# Patient Record
Sex: Female | Born: 1960
Health system: Southern US, Community
[De-identification: ages and names within clinical notes are randomized; demographics above are authoritative.]

## PROBLEM LIST (undated history)

## (undated) DIAGNOSIS — I2510 Atherosclerotic heart disease of native coronary artery without angina pectoris: Secondary | ICD-10-CM

## (undated) DIAGNOSIS — E785 Hyperlipidemia, unspecified: Secondary | ICD-10-CM

## (undated) DIAGNOSIS — I10 Essential (primary) hypertension: Secondary | ICD-10-CM

## (undated) HISTORY — DX: Essential (primary) hypertension: I10

## (undated) HISTORY — DX: Atherosclerotic heart disease of native coronary artery without angina pectoris: I25.10

## (undated) HISTORY — DX: Hyperlipidemia, unspecified: E78.5

---

## 1999-08-08 ENCOUNTER — Other Ambulatory Visit: Admission: RE | Admit: 1999-08-08 | Discharge: 1999-08-08 | Payer: Self-pay | Admitting: Gynecology

## 2005-03-27 ENCOUNTER — Other Ambulatory Visit: Admission: RE | Admit: 2005-03-27 | Discharge: 2005-03-27 | Payer: Self-pay | Admitting: Gynecology

## 2007-06-30 ENCOUNTER — Inpatient Hospital Stay (HOSPITAL_COMMUNITY): Admission: EM | Admit: 2007-06-30 | Discharge: 2007-07-03 | Payer: Self-pay | Admitting: Emergency Medicine

## 2007-07-18 ENCOUNTER — Encounter (HOSPITAL_COMMUNITY): Admission: RE | Admit: 2007-07-18 | Discharge: 2007-10-16 | Payer: Self-pay | Admitting: Interventional Cardiology

## 2007-10-17 ENCOUNTER — Encounter (HOSPITAL_COMMUNITY): Admission: RE | Admit: 2007-10-17 | Discharge: 2007-10-18 | Payer: Self-pay | Admitting: Interventional Cardiology

## 2009-02-01 IMAGING — CR DG CHEST 1V PORT
1 series · 1 of 1 positions shown · non-contrast
Comparison: None.

CLINICAL DATA: Post-cardiac cath for myocardial infarction.  
 PORTABLE CHEST ? 1 VIEW:

[AP]
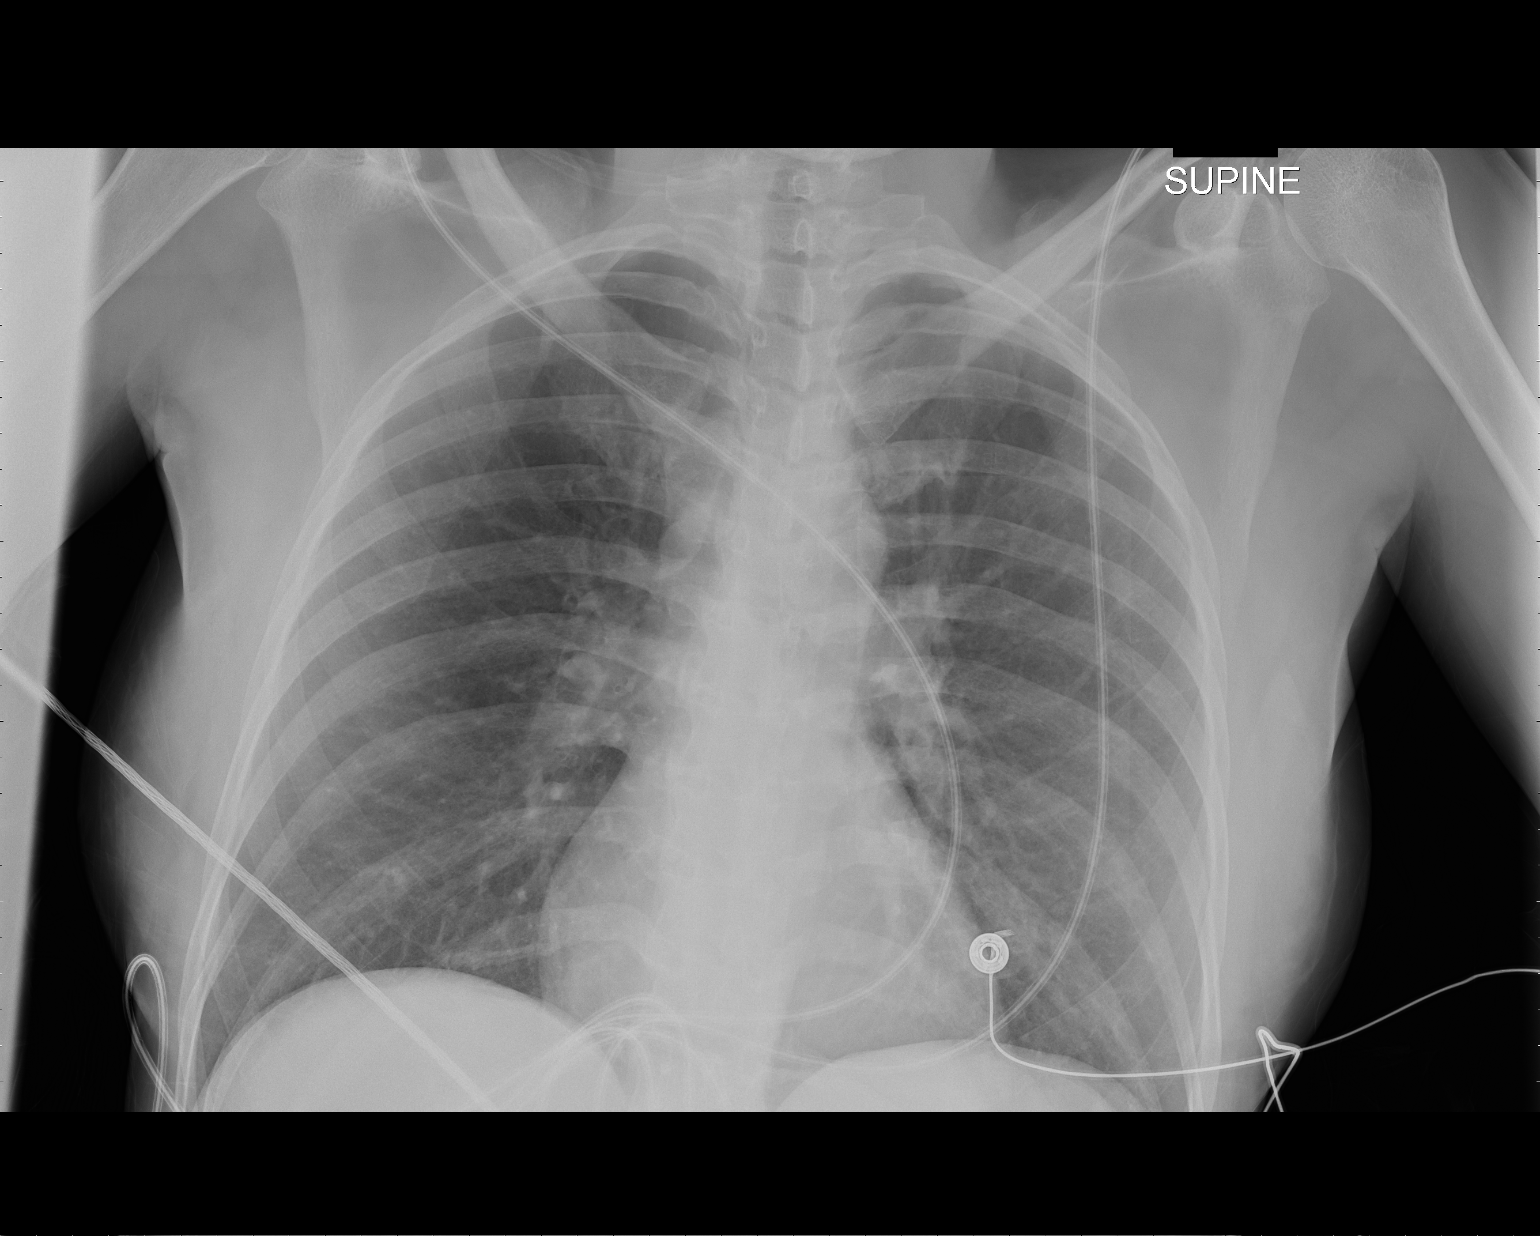

[1 of 1 positions shown; findings below may reference images not displayed]

FINDINGS: Trachea is midline.  Heart size normal.  Lungs are clear.  No pleural fluid.
IMPRESSION: No acute findings.

## 2010-10-18 NOTE — Cardiovascular Report (Signed)
NAMEDORCAS, MELITO NO.:  [id number]   MEDICAL RECORD NO.:  [id number]          PATIENT TYPE:  INP   LOCATION:  2913                         FACILITY:  [facility]   PHYSICIAN:  Lyn Records, M.D.   DATE OF BIRTH:  [birth year]   DATE OF PROCEDURE:  DATE OF DISCHARGE:                            CARDIAC CATHETERIZATION   REASON FOR CATHETERIZATION:  Acute inferior wall ST elevation myocardial  infarction.   PROCEDURES PERFORMED:  1. Right heart catheterization via right femoral approach.  2. Selective coronary angiography.  3. Left ventriculography.  4. Bare-metal stent implantation in the distal circumflex.   DESCRIPTION:  After informed consent, the patient was brought to the  cath lab under emergency circumstances.  The patient received aspirin  325 mg chewable in the emergency room.  In the cath lab, she was given  600 mg of oral Plavix and started on a bolus followed by an infusion of  Angiomax.  The Angiomax dose is 1.75 mg per kilogram per hour infusion  and a bolus of 0.75 mg per kilogram was given over 2 minutes at the  beginning of the infusion.  Because of inability for Plavix to have any  antiplatelet effect, we also gave a single bolus followed by an infusion  of Integrilin.  A bolus of 180 micrograms per kilogram was given  followed by an infusion set at 2 micrograms per kilogram per minute.   A 6-French sheath was started in the right femoral artery using modified  Seldinger technique.  Coronary angiography was then performed with a 6-  [place] A2 multipurpose catheter.  We used 6-French #4 left and right  Judkins catheters as well.  We identified the distal circumflex as the  infarct vessel.   We used a CLS 3.0 6-French left-system guide catheter.  We had a  difficult time wiring the distal circumflex because of an acute angle of  continuation after the large first obtuse marginal.  We eventually were  able to cross the stenosis using a BMW wire.   An Clinical cytogeneticist wire  continued to prolapse into the large obtuse marginal branch after we  would enter the circumflex.  After eventually getting what we felt was a  distal position in the distal circumflex, we performed angioplasty at  low pressure with a 2.5 Maverick balloon.  Realizing that the distal  vessel was considerably smaller than 2.5 in diameter, we downsized the  2.0-mm Maverick balloon and got reperfusion.  After recanalization of  the vessel, we deployed, in overlapping fashion, Vision 15-mm long  stents in the distal circumflex.  We reduced the 100% stenosis to 0%.  No complications occurred other than AIVR and some ventricular ectopy  after reperfusion.   We visualized the femoral artery but felt that it was too small in  diameter to close.   RESULTS:  1. Hemodynamic data:      a.     Aortic pressure 169/60.      b.     Left ventricular pressure 175/14.  2. Left ventriculography:  Inferobasal akinesis, EF 65%, no MR.  3. Coronary angiography:  a.     Left main coronary artery short with minimal plaquing.      b.     Left anterior descending coronary:  Large with a branching       large first diagonal, widely patent.      c.     Circumflex artery:  Normal with a large branching first       obtuse marginal.  Continuation of the circumflex beyond the first       obtuse marginal is totally occluded distally.      d.     Right coronary:  Large with a large PDA, widely patent.  4. Percutaneous coronary intervention:  The distal circumflex was 100%      occluded.  After overlap stenting with 2.25-mm Vision stents, the      stenosis was reduced to 0% with TIMI grade 3 flow.  The vessel was      postdilated with 2.25-mm diameter Quantum balloon to 13      atmospheres.   CONCLUSION:  1. Acute inferior wall ST-segment elevation myocardial infarction,      secondary to total occlusion of the distal circumflex beyond a      large first obtuse marginal.  2. Successful  bare-metal stent implantation with overlapping stents in      the distal circumflex,  taking a 100% stenosis to 0% with TIMI      grade 3 flow.  3. Widely patent left anterior descending, left main proximal and mid      circumflex, first obtuse marginal, and right coronary.  4. Inferior wall akinesis with preserved overall left ventricular      function, ejection fraction 60%.   PLAN:  Plavix, aspirin for 3 months.  Aggressive risk factor  modification including treatment of lipids.  Control of blood pressure.  ICU status.      Lyn Records, M.D.  Electronically Signed     HWS/MEDQ  D:  06/30/2007  T:  07/01/2007  Job:  [id number]   cc:   Marcial Pacas P. Fontaine, M.D.

## 2010-10-18 NOTE — H&P (Signed)
NAMECATHARINA, Ramirez NO.:  [id number]   MEDICAL RECORD NO.:  [id number]          PATIENT TYPE:  INP   LOCATION:  2913                         FACILITY:  [facility]   PHYSICIAN:  Lyn Records, M.D.   DATE OF BIRTH:  08-21-1960   DATE OF ADMISSION:  06/30/2007  DATE OF DISCHARGE:                              HISTORY & PHYSICAL   REASON FOR ADMISSION:  Acute inferior wall STEMI.   HISTORY OF PRESENT ILLNESS:  Ms. [name] is a very pleasant 50 year old  African-American female with no prior significant medical history.  She  has a history of diet-controlled hypertension.  She awakened this  morning at 4 a.m. with bilateral, left-greater-than-right arm aching,  nausea, vomiting, and mild chest tightness.  She came to the emergency  room at 11:30, where the EKG at 11:30 revealed mild inferior ST segment  elevation.  The call for STEMI did not go out, however, until  approximately 11:50 a.m.   In the emergency room, I questioned the patient.  I demonstrated that  her symptoms had been going on since around 4 a.m. when she awakened  with discomfort.  She gives no other history of prior heart trouble.  The EKG revealed 0.5 to 1 mm ofST elevation in II, III, and aVF.   PAST MEDICAL HISTORY:  1. Hypertension.  2. Hyperlipidemia.  3. Smoker.   HABITS:  Smokes cigarettes but does not drink alcohol.   PREVIOUS SURGERIES:  None.   FAMILY HISTORY:  History of xanthelasma in the family on her maternal  side.  No history of coronary atherosclerosis/coronary artery disease.   REVIEW OF SYSTEMS:  She is not pregnant.  She has never had a child.   ALLERGIES:  None.   PHYSICAL EXAMINATION:  VITAL SIGNS:  Blood pressure 178/88, heart rate  80, weight is not obtained.  SKIN:  Dry.  HEENT:  Xanthelasma is noted on the left eye.  There are petechiae or  telangiectasia on the face.  She states that she has had red spots like  this on her face before when she has had intractable  vomiting.  HEENT  exam grossly otherwise unremarkable.  NECK:  No JVD or carotid bruit.  LUNGS:  Clear to auscultation and percussion.  CARDIAC:  No murmur.  No rub.  No click.  No gallop.  ABDOMEN:  Soft, bowel sounds are normal.  No tenderness.  EXTREMITIES:  No edema.  Femoral, posterior tibial, brachial, and  carotid pulses are 2+.  NEUROLOGIC:  Normal.  No motor or sensory deficits noted.   EKG reveals 0.5 to 1 mm ST segment elevation, II, III, aVF with  reciprocal ST segment depression in V1 through V3.   Chest x-ray has not been performed.   ASSESSMENT:  1. Inferior ST-segment elevation myocardial infarction, approximately      9 to 10 hours in duration with continued left arm discomfort.  2. Hypertension.  3. Hyperlipidemia.  4. Suspect hypercholesterolemia with xanthelasma present on the left      eye.   PLAN:  1. Cath lab and discussion with the patient and husband about  procedure and risks, including death, myocardial infarction,      stroke, bleeding, ischemic limb, renal failure, and allergy.  2. Plavix 600 mg.  3. Angiomax as the antithrombotic.  4. Single dose Integrilin.  5. Intravenous nitroglycerin.  6. Aspirin.      Lyn Records, M.D.  Electronically Signed     HWS/MEDQ  D:  06/30/2007  T:  07/01/2007  Job:  [id number]   cc:   [name], M.D.

## 2010-10-18 NOTE — Discharge Summary (Signed)
NAMELAKEYSHIA, [name] NO.:  [id number]   MEDICAL RECORD NO.:  [id number]          PATIENT TYPE:  INP   LOCATION:  3708                         FACILITY:  [facility]   PHYSICIAN:  Lyn Records, M.D.   DATE OF BIRTH:  04-Jun-1961   DATE OF ADMISSION:  06/30/2007  DATE OF DISCHARGE:  07/03/2007                               DISCHARGE SUMMARY   ADMITTING DIAGNOSIS:  Acute posterior wall myocardial infarction.   DISCHARGE DIAGNOSES:  1. Coronary atherosclerotic heart disease.      a.     Posterior myocardial infarction treated with bare-metal       stents approximately 12 hours into the infarction on June 30, 2007.  2. Hyperlipidemia.  3. Labile blood pressure.  4. Cigarette smoker.   CONDITION AT DISCHARGE:  Improved.   PROCEDURE PERFORMED:  Acute cardiac catheterization with percutaneous  coronary intervention and implantation of overlapping bare-metal stents  in the distal circumflex.   DISCHARGE PLAN:  1. Follow up Dr. Verdis Prime on July 17, 2007 at 9:45 AM.  2. Medications:      a.     Plavix 75 mg per day for at least a month.      b.     Aspirin 325 mg per day.      c.     Metoprolol ER 25 mg per day.      d.     Simvastatin 40 mg per day.      e.     Nitroglycerin 0.4 mg sublingually p.r.n.   ACTIVITY:  1. Activity has been discussed with the patient.  She is strongly      encouraged to enroll in phase 2 cardiac rehab.  2. She should not return to work until after office visit on July 17, 2007.  3. She should call if any recurrent chest pain.   COMMENTS:  The patient was admitted to the hospital on June 30, 2007  after being awakened from sleep with left greater than right arm pain at  4 a.m.  Her presentation to [facility] was on 11:30 in the morning, some  7-1/2 to 8 hours into the presentation.  She made it to the cath lab by  12:30 where successful percutaneous intervention was performed.  Please  refer to the cardiac  cath report.   The patient is discharged on the medical regimen listed above and will  have phase 2 cardiac rehab.   We discussed her prognosis which overall should be quite good assuming  aggressive risk factor modification and smoking cessation.      Lyn Records, M.D.  Electronically Signed     HWS/MEDQ  D:  07/03/2007  T:  07/03/2007  Job:  [id number]

## 2011-02-24 LAB — I-STAT 8, (EC8 V) (CONVERTED LAB)
BUN: 5 — ABNORMAL LOW
Chloride: 107
Glucose, Bld: 116 — ABNORMAL HIGH
HCT: 48 — ABNORMAL HIGH
Hemoglobin: 16.3 — ABNORMAL HIGH
Operator id: 265201
Potassium: 3.9
Sodium: 138

## 2011-02-24 LAB — COMPREHENSIVE METABOLIC PANEL
ALT: 24
AST: 106 — ABNORMAL HIGH
CO2: 19
Calcium: 8.1 — ABNORMAL LOW
Chloride: 104
Creatinine, Ser: 0.49
GFR calc non Af Amer: >60
Glucose, Bld: 108 — ABNORMAL HIGH
Total Bilirubin: 0.5

## 2011-02-24 LAB — POCT CARDIAC MARKERS
Operator id: 265201
Troponin i, poc: 0.28 — ABNORMAL HIGH

## 2011-02-24 LAB — BASIC METABOLIC PANEL
BUN: 5 — ABNORMAL LOW
Chloride: 108
Creatinine, Ser: 0.57
GFR calc non Af Amer: >60

## 2011-02-24 LAB — DIFFERENTIAL
Basophils Absolute: 0
Basophils Relative: 0
Eosinophils Relative: 0
Lymphocytes Relative: 8 — ABNORMAL LOW
Neutro Abs: 9.6 — ABNORMAL HIGH

## 2011-02-24 LAB — LIPID PANEL: Total CHOL/HDL Ratio: 3.9

## 2011-02-24 LAB — PROTIME-INR
Prothrombin Time: 12.7
Prothrombin Time: 13.8

## 2011-02-24 LAB — CBC
HCT: 43.3
MCHC: 33.7
Platelets: 171
Platelets: 275
RDW: 12.9
RDW: 13
WBC: 11.1 — ABNORMAL HIGH

## 2011-02-24 LAB — CK TOTAL AND CKMB (NOT AT ARMC)
CK, MB: 136.2 — ABNORMAL HIGH
CK, MB: 237.6 — ABNORMAL HIGH
Relative Index: 15.3 — ABNORMAL HIGH
Total CK: 1334 — ABNORMAL HIGH
Total CK: 888 — ABNORMAL HIGH

## 2011-02-24 LAB — I-STAT EC8
Acid-base deficit: 9 — ABNORMAL HIGH
Bicarbonate: 17.3 — ABNORMAL LOW
Hemoglobin: 12.2
Operator id: 211741
Potassium: 3.2 — ABNORMAL LOW
Sodium: 123 — ABNORMAL LOW
TCO2: 18

## 2011-02-24 LAB — TSH: TSH: 0.635

## 2011-02-24 LAB — APTT: aPTT: 24

## 2011-02-24 LAB — HOMOCYSTEINE: Homocysteine: 6.4

## 2013-03-10 ENCOUNTER — Other Ambulatory Visit: Payer: Self-pay | Admitting: Interventional Cardiology

## 2013-09-04 ENCOUNTER — Other Ambulatory Visit: Payer: Self-pay | Admitting: *Deleted

## 2013-09-04 DIAGNOSIS — E785 Hyperlipidemia, unspecified: Secondary | ICD-10-CM

## 2013-09-10 ENCOUNTER — Other Ambulatory Visit (INDEPENDENT_AMBULATORY_CARE_PROVIDER_SITE_OTHER): Payer: BC Managed Care – PPO

## 2013-09-10 ENCOUNTER — Other Ambulatory Visit: Payer: Self-pay | Admitting: Cardiology

## 2013-09-10 DIAGNOSIS — E785 Hyperlipidemia, unspecified: Secondary | ICD-10-CM

## 2013-09-10 LAB — HEPATIC FUNCTION PANEL
ALK PHOS: 44 U/L (ref 39–117)
ALT: 11 U/L (ref 0–35)
AST: 15 U/L (ref 0–37)
Albumin: 3.8 g/dL (ref 3.5–5.2)
BILIRUBIN DIRECT: 0 mg/dL (ref 0.0–0.3)
BILIRUBIN TOTAL: 0.6 mg/dL (ref 0.3–1.2)
Total Protein: 6.7 g/dL (ref 6.0–8.3)

## 2013-09-10 LAB — LIPID PANEL
Cholesterol: 142 mg/dL (ref 0–200)
HDL: 55.4 mg/dL (ref >39.00)
LDL CALC: 70 mg/dL (ref 0–99)
Total CHOL/HDL Ratio: 3
Triglycerides: 85 mg/dL (ref 0.0–149.0)
VLDL: 17 mg/dL (ref 0.0–40.0)

## 2013-09-10 MED ORDER — LISINOPRIL-HYDROCHLOROTHIAZIDE 20-12.5 MG PO TABS: ORAL_TABLET | ORAL | Status: DC

## 2013-09-18 ENCOUNTER — Telehealth: Payer: Self-pay

## 2013-09-18 DIAGNOSIS — E785 Hyperlipidemia, unspecified: Secondary | ICD-10-CM

## 2013-09-18 NOTE — Telephone Encounter (Signed)
pt given lab results.Very very good. No change in therapy. Will need repeat in 1 year.pt verbalized understanding.pt rqst copy be mailed.

## 2013-09-18 NOTE — Telephone Encounter (Signed)
Message copied by Jarvis NewcomerPARRIS-GODLEY, Zalmen Wrightsman S on Thu Sep 18, 2013  3:30 PM ------      Message from: Verdis PrimeSMITH, HENRY      Created: Thu Sep 11, 2013  4:33 PM       Very very good. No change in therapy. Will need repeat in 1 year ------

## 2013-09-22 ENCOUNTER — Ambulatory Visit (INDEPENDENT_AMBULATORY_CARE_PROVIDER_SITE_OTHER): Payer: BC Managed Care – PPO | Admitting: Interventional Cardiology

## 2013-09-22 ENCOUNTER — Encounter: Payer: Self-pay | Admitting: Interventional Cardiology

## 2013-09-22 VITALS — BP 120/60 | HR 63 | Ht 62.0 in | Wt 113.0 lb

## 2013-09-22 DIAGNOSIS — I2510 Atherosclerotic heart disease of native coronary artery without angina pectoris: Secondary | ICD-10-CM

## 2013-09-22 DIAGNOSIS — E785 Hyperlipidemia, unspecified: Secondary | ICD-10-CM

## 2013-09-22 DIAGNOSIS — I10 Essential (primary) hypertension: Secondary | ICD-10-CM

## 2013-09-22 HISTORY — DX: Hyperlipidemia, unspecified: E78.5

## 2013-09-22 HISTORY — DX: Atherosclerotic heart disease of native coronary artery without angina pectoris: I25.10

## 2013-09-22 HISTORY — DX: Essential (primary) hypertension: I10

## 2013-09-22 NOTE — Progress Notes (Signed)
Patient ID: [name], female   DOB: 06/17/1960, 53 y.o.   MRN: [id number]    [address] [place], [place]  [address] Phone: [phone] Fax:  [phone]  Date:  09/22/2013   ID:  [name], DOB 08/29/1960, MRN [id number]  PCP:  No primary provider on file.   ASSESSMENT:  1. Coronary artery disease, history of bare-metal stent in circumflex 2009 2. Hyperlipidemia, stable on therapy 3. Hypertension, controlled  PLAN:  1. Exercise treadmill test to rule out in-stent restenosis of circumflex 2. Clinical followup one year after exercise treadmill test assuming the stress test is normal 3. No change in medical therapy   SUBJECTIVE: [name] is a 53 y.o. female who is asymptomatic now 6 years status post acute myocardial infarction requiring bare-metal stent implantation in the circumflex. She has discontinued smoking. She remains active. She has no limitations.  Wt Readings from Last 3 Encounters:  09/22/13 113 lb (51.256 kg)     Past Medical History  Diagnosis Date  . CAD in native artery 09/22/2013    Bare-metal stent, circumflex, 2009   . Hyperlipidemia 09/22/2013  . Essential hypertension 09/22/2013    Current Outpatient Prescriptions  Medication Sig Dispense Refill  . amLODipine (NORVASC) 5 MG tablet Take 5 mg by mouth daily.      Marland Kitchen. aspirin 81 MG tablet Take 81 mg by mouth daily.      Marland Kitchen. atorvastatin (LIPITOR) 20 MG tablet Take 20 mg by mouth daily.      Marland Kitchen. lisinopril-hydrochlorothiazide (PRINZIDE,ZESTORETIC) 20-12.5 MG per tablet TAKE 1 TABLET BY MOUTH EVERY DAY  90 tablet  0  . metoprolol succinate (TOPROL-XL) 50 MG 24 hr tablet Take 50 mg by mouth daily. Take with or immediately following a meal.       No current facility-administered medications for this visit.    Allergies:    Allergies  Allergen Reactions  . Sulfa Antibiotics     Social History:  The patient     ROS:  Please see the history of present illness.   Denies  edema, orthopnea, PND.   All other systems reviewed and negative.   OBJECTIVE: VS:  BP 120/60  Pulse 63  Ht 5\' 2"  (1.575 m)  Wt 113 lb (51.256 kg)  BMI 20.66 kg/m2 Well nourished, well developed, in no acute distress, younger than stated age HEENT: normal Neck: JVD flat. Carotid bruit 2+  Cardiac:  normal S1, S2; RRR; no murmur Lungs:  clear to auscultation bilaterally, no wheezing, rhonchi or rales Abd: soft, nontender, no hepatomegaly Ext: Edema absent. Pulses 2+ Skin: warm and dry Neuro:  CNs 2-12 intact, no focal abnormalities noted  EKG:  Normal sinus rhythm with inferior biphasic T waves. Otherwise unremarkable.       Signed, Darci NeedleHenry W. B. Chasten Blaze III, MD 09/22/2013 10:01 AM

## 2013-09-22 NOTE — Patient Instructions (Signed)
Your physician recommends that you continue on your current medications as directed. Please refer to the Current Medication list given to you today.  Your physician has requested that you have an exercise tolerance test. For further information please visit [email or web address]. Please also follow instruction sheet, as given.   Your physician wants you to follow-up in: 1 year You will receive a reminder letter in the mail two months in advance. If you don't receive a letter, please call our office to schedule the follow-up appointment.  

## 2013-10-10 ENCOUNTER — Other Ambulatory Visit: Payer: Self-pay

## 2013-10-10 MED ORDER — AMLODIPINE BESYLATE 5 MG PO TABS: 5.0000 mg | ORAL_TABLET | Freq: Every day | ORAL | Status: DC

## 2013-10-10 MED ORDER — METOPROLOL SUCCINATE ER 50 MG PO TB24: 50.0000 mg | ORAL_TABLET | Freq: Every day | ORAL | Status: DC

## 2013-10-31 ENCOUNTER — Ambulatory Visit (INDEPENDENT_AMBULATORY_CARE_PROVIDER_SITE_OTHER): Payer: BC Managed Care – PPO | Admitting: Nurse Practitioner

## 2013-10-31 ENCOUNTER — Encounter: Payer: Self-pay | Admitting: Nurse Practitioner

## 2013-10-31 VITALS — BP 132/90 | HR 81

## 2013-10-31 DIAGNOSIS — I2510 Atherosclerotic heart disease of native coronary artery without angina pectoris: Secondary | ICD-10-CM

## 2013-10-31 NOTE — Progress Notes (Signed)
Exercise Treadmill Test  Pre-Exercise Testing Evaluation Rhythm: normal sinus  Rate: 67 bpm     Test  Exercise Tolerance Test Ordering MD: Verdis Prime, MD  Interpreting MD: Norma Fredrickson, NP  Unique Test No: 1  Treadmill:  1  Indication for ETT: known ASHD  Contraindication to ETT: No   Stress Modality: exercise - treadmill  Cardiac Imaging Performed: non   Protocol: standard Bruce - maximal  Max BP:  171/92  Max MPHR (bpm):  170 85% MPR (bpm):  145  MPHR obtained (bpm):  149 % MPHR obtained:  87%  Reached 85% MPHR (min:sec):  10:30 Total Exercise Time (min-sec):  11 minutes  Workload in METS:  13.3 Borg Scale: 19  Reason ETT Terminated:  patient's desire to stop    ST Segment Analysis At Rest: normal ST segments - no evidence of significant ST depression - has resting T wave changes With Exercise: non-specific ST changes  Other Information Arrhythmia:  Yes - rare PVC and couplet Angina during ETT:  absent (0) Quality of ETT:  diagnostic  ETT Interpretation:  normal - no evidence of ischemia by ST analysis  Comments: Patient presents today for routine GXT. Has had CAD with past BMS in 2009. Former smoker, has, HTN, and HLD.   Today the patient exercised on the standard Bruce protocol for a total of 11 minutes.  Good exercise tolerance.  Adequate blood pressure response.  Clinically negative for chest pain. Test was stopped due to fatigue.  EKG negative for ischemia - has resting changes. No significant arrhythmia noted.    Recommendations: CV risk factor modification. See back as planned.  Patient is agreeable to this plan and will call if any problems develop in the interim.   Rosalio Macadamia, RN, ANP-C [facility] [address] [place], [place]  [address] [phone]

## 2013-12-11 ENCOUNTER — Other Ambulatory Visit: Payer: Self-pay

## 2013-12-11 MED ORDER — ATORVASTATIN CALCIUM 20 MG PO TABS: 20.0000 mg | ORAL_TABLET | Freq: Every day | ORAL | Status: DC

## 2013-12-26 ENCOUNTER — Encounter: Payer: Self-pay | Admitting: Interventional Cardiology

## 2014-01-13 ENCOUNTER — Other Ambulatory Visit: Payer: Self-pay | Admitting: Interventional Cardiology

## 2014-01-20 ENCOUNTER — Other Ambulatory Visit: Payer: Self-pay | Admitting: Interventional Cardiology

## 2014-01-22 ENCOUNTER — Other Ambulatory Visit: Payer: Self-pay | Admitting: Interventional Cardiology

## 2014-05-13 ENCOUNTER — Other Ambulatory Visit: Payer: Self-pay | Admitting: *Deleted

## 2014-05-13 MED ORDER — AMLODIPINE BESYLATE 5 MG PO TABS: 5.0000 mg | ORAL_TABLET | Freq: Every day | ORAL | Status: DC

## 2014-05-13 MED ORDER — METOPROLOL SUCCINATE ER 50 MG PO TB24: 50.0000 mg | ORAL_TABLET | Freq: Every day | ORAL | Status: DC

## 2014-09-12 ENCOUNTER — Other Ambulatory Visit: Payer: Self-pay | Admitting: Interventional Cardiology

## 2014-09-17 ENCOUNTER — Other Ambulatory Visit: Payer: Self-pay | Admitting: Interventional Cardiology

## 2014-10-14 ENCOUNTER — Other Ambulatory Visit: Payer: Self-pay | Admitting: Interventional Cardiology

## 2014-11-10 ENCOUNTER — Other Ambulatory Visit: Payer: Self-pay | Admitting: Interventional Cardiology

## 2014-12-02 ENCOUNTER — Encounter: Payer: Self-pay | Admitting: *Deleted

## 2014-12-02 NOTE — Progress Notes (Signed)
Cardiology Office Note   Date:  12/03/2014   ID:  [name], DOB 09/23/1960, MRN [id number]  PCP:  No primary care provider on file.  Cardiologist:  Sinclair Grooms, MD   Chief Complaint  Patient presents with  . Coronary Artery Disease      History of Present Illness: Taylan Mayhan is a 54 y.o. female who presents for CAD (posterior infarct treated with overlapping bare-metal stents in distal circumflex 2009), hypertension, and hyperlipidemia.  She has had a great year. She is totally asymptomatic. Denies any episodes of chest pain shortness of breath or palpitations. No medication side effects.    Past Medical History  Diagnosis Date  . CAD in native artery 09/22/2013    Bare-metal stent, circumflex, 2009   . Hyperlipidemia 09/22/2013  . Essential hypertension 09/22/2013    No past surgical history on file.   Current Outpatient Prescriptions  Medication Sig Dispense Refill  . amLODipine (NORVASC) 5 MG tablet Take 1 tablet (5 mg total) by mouth daily. 30 tablet 11  . aspirin 81 MG tablet Take 81 mg by mouth daily.    Marland Kitchen atorvastatin (LIPITOR) 20 MG tablet Take 1 tablet (20 mg total) by mouth daily. 30 tablet 11  . lisinopril-hydrochlorothiazide (PRINZIDE,ZESTORETIC) 20-12.5 MG per tablet Take 1 tablet by mouth daily. 30 tablet 11  . metoprolol succinate (TOPROL-XL) 50 MG 24 hr tablet TAKE 1 TABLET (50 MG TOTAL) BY MOUTH DAILY. TAKE WITH OR IMMEDIATELY FOLLOWING A MEAL. 30 tablet 11   No current facility-administered medications for this visit.    Allergies:   Sulfa antibiotics    Social History:  The patient  reports that she quit smoking about 7 years ago. She does not have any smokeless tobacco history on file. She reports that she drinks alcohol.   Family History:  The patient's family history includes Aneurysm in her mother; Cancer in her father; Hypertension in her brother, brother, and sister.    ROS:  Please see the history of present illness.    Otherwise, review of systems are positive for none.   All other systems are reviewed and negative.    PHYSICAL EXAM: VS:  BP 138/82 mmHg  Pulse 61  Ht _0  (1.575 m)  Wt 52.672 kg (116 lb 1.9 oz)  BMI 21.23 kg/m2  LMP 11/23/2014 (Exact Date) , BMI Body mass index is 21.23 kg/(m^2). GEN: Well nourished, well developed, in no acute distress HEENT: normal Neck: no JVD, carotid bruits, or masses Cardiac: RRR; no murmurs, rubs, or gallops,no edema  Respiratory:  clear to auscultation bilaterally, normal work of breathing GI: soft, nontender, nondistended, + BS MS: no deformity or atrophy Skin: warm and dry, no rash Neuro:  Strength and sensation are intact Psych: euthymic mood, full affect   EKG:  EKG is ordered today. The ekg ordered today demonstrates a sinus rhythm with nonspecific T-wave flattening   Recent Labs: No results found for requested labs within last 365 days.    Lipid Panel    Component Value Date/Time   CHOL 142 09/10/2013 0756   TRIG 85.0 09/10/2013 0756   HDL 55.40 09/10/2013 0756   CHOLHDL 3 09/10/2013 0756   VLDL 17.0 09/10/2013 0756   LDLCALC 70 09/10/2013 0756      Wt Readings from Last 3 Encounters:  12/03/14 52.672 kg (116 lb 1.9 oz)  09/22/13 51.256 kg (113 lb)      Other studies Reviewed: Additional studies/ records that were reviewed today include: .  ASSESSMENT AND PLAN:  1. CAD in native artery Asymptomatic  2. Essential hypertension Controlled. We need to check her electrolytes to make sure kidney function and potassium were stable.  3. Hyperlipidemia Current status not known but will be checked today    Current medicines are reviewed at length with the patient today.  The patient does not have concerns regarding medicines.  The following changes have been made:  no change  Labs/ tests ordered today include:   Orders Placed This Encounter  Procedures  . Lipid panel  . Comp Met (CMET)  . EKG 12-Lead      Disposition:   FU with HS in 1 year  Signed, Sinclair Grooms, MD  12/03/2014 8:44 AM    [facility] [address], [place], [place]  [address] Phone: [phone]; Fax: [phone]

## 2014-12-03 ENCOUNTER — Ambulatory Visit (INDEPENDENT_AMBULATORY_CARE_PROVIDER_SITE_OTHER): Payer: BLUE CROSS/BLUE SHIELD | Admitting: Interventional Cardiology

## 2014-12-03 ENCOUNTER — Telehealth: Payer: Self-pay

## 2014-12-03 ENCOUNTER — Other Ambulatory Visit (INDEPENDENT_AMBULATORY_CARE_PROVIDER_SITE_OTHER): Payer: BLUE CROSS/BLUE SHIELD

## 2014-12-03 ENCOUNTER — Encounter: Payer: Self-pay | Admitting: Interventional Cardiology

## 2014-12-03 VITALS — BP 138/82 | HR 61 | Ht 62.0 in | Wt 116.1 lb

## 2014-12-03 DIAGNOSIS — E785 Hyperlipidemia, unspecified: Secondary | ICD-10-CM | POA: Diagnosis not present

## 2014-12-03 DIAGNOSIS — I2510 Atherosclerotic heart disease of native coronary artery without angina pectoris: Secondary | ICD-10-CM

## 2014-12-03 DIAGNOSIS — I10 Essential (primary) hypertension: Secondary | ICD-10-CM | POA: Diagnosis not present

## 2014-12-03 LAB — COMPREHENSIVE METABOLIC PANEL
ALBUMIN: 4.1 g/dL (ref 3.5–5.2)
ALT: 13 U/L (ref 0–35)
AST: 16 U/L (ref 0–37)
Alkaline Phosphatase: 56 U/L (ref 39–117)
BUN: 12 mg/dL (ref 6–23)
CALCIUM: 9.3 mg/dL (ref 8.4–10.5)
CO2: 29 mEq/L (ref 19–32)
CREATININE: 0.6 mg/dL (ref 0.40–1.20)
Chloride: 105 mEq/L (ref 96–112)
GFR: 135.01 mL/min (ref >60.00)
GLUCOSE: 107 mg/dL — AB (ref 70–99)
Potassium: 3.6 mEq/L (ref 3.5–5.1)
Sodium: 140 mEq/L (ref 135–145)
TOTAL PROTEIN: 7 g/dL (ref 6.0–8.3)
Total Bilirubin: 0.3 mg/dL (ref 0.2–1.2)

## 2014-12-03 LAB — LIPID PANEL
Cholesterol: 168 mg/dL (ref 0–200)
HDL: 56.1 mg/dL (ref >39.00)
LDL Cholesterol: 97 mg/dL (ref 0–99)
NonHDL: 111.9
Total CHOL/HDL Ratio: 3
Triglycerides: 74 mg/dL (ref 0.0–149.0)
VLDL: 14.8 mg/dL (ref 0.0–40.0)

## 2014-12-03 MED ORDER — METOPROLOL SUCCINATE ER 50 MG PO TB24: ORAL_TABLET | ORAL | Status: DC

## 2014-12-03 MED ORDER — AMLODIPINE BESYLATE 5 MG PO TABS: 5.0000 mg | ORAL_TABLET | Freq: Every day | ORAL | Status: DC

## 2014-12-03 MED ORDER — ATORVASTATIN CALCIUM 20 MG PO TABS: 20.0000 mg | ORAL_TABLET | Freq: Every day | ORAL | Status: DC

## 2014-12-03 MED ORDER — LISINOPRIL-HYDROCHLOROTHIAZIDE 20-12.5 MG PO TABS: 1.0000 | ORAL_TABLET | Freq: Every day | ORAL | Status: DC

## 2014-12-03 NOTE — Patient Instructions (Signed)
Medication Instructions:  Your physician recommends that you continue on your current medications as directed. Please refer to the Current Medication list given to you today. Your medications have been refills have been sent to your pharmacy  Labwork: Lipid, Cmet  Testing/Procedures: None   Follow-Up: Your physician wants you to follow-up in: 1 year with Dr.Smith You will receive a reminder letter in the mail two months in advance. If you don't receive a letter, please call our office to schedule the follow-up appointment.   Any Other Special Instructions Will Be Listed Below (If Applicable).

## 2014-12-03 NOTE — Telephone Encounter (Signed)
-----   Message from Lyn RecordsHenry W Smith, MD sent at 12/03/2014  5:12 PM EDT ----- Lipids are at target. Liver tests, kidney function, and potassium all within a reasonable range. Blood sugar is minimally elevated.

## 2014-12-03 NOTE — Telephone Encounter (Signed)
Pt aware of lab results with verbal understanding Lipids are at target. Liver tests, kidney function, and potassium all within a reasonable range. Blood sugar is minimally elevated. Pt rqst a copy of labs be mailed. done

## 2014-12-23 ENCOUNTER — Other Ambulatory Visit: Payer: Self-pay | Admitting: *Deleted

## 2014-12-23 MED ORDER — ATORVASTATIN CALCIUM 20 MG PO TABS: 20.0000 mg | ORAL_TABLET | Freq: Every day | ORAL | Status: DC

## 2014-12-23 MED ORDER — LISINOPRIL-HYDROCHLOROTHIAZIDE 20-12.5 MG PO TABS: 1.0000 | ORAL_TABLET | Freq: Every day | ORAL | Status: DC

## 2014-12-23 MED ORDER — METOPROLOL SUCCINATE ER 50 MG PO TB24: ORAL_TABLET | ORAL | Status: DC

## 2015-01-04 ENCOUNTER — Other Ambulatory Visit: Payer: Self-pay

## 2015-01-04 ENCOUNTER — Telehealth: Payer: Self-pay | Admitting: Interventional Cardiology

## 2015-01-04 NOTE — Telephone Encounter (Signed)
Patient has changed prescription plans.  She would like her prescriptions called to [facility] :Fax# [phone] and Phone# [phone]

## 2015-01-05 ENCOUNTER — Other Ambulatory Visit: Payer: Self-pay | Admitting: *Deleted

## 2015-01-05 MED ORDER — LISINOPRIL-HYDROCHLOROTHIAZIDE 20-12.5 MG PO TABS: 1.0000 | ORAL_TABLET | Freq: Every day | ORAL | Status: DC

## 2015-01-05 MED ORDER — METOPROLOL SUCCINATE ER 50 MG PO TB24: ORAL_TABLET | ORAL | Status: DC

## 2015-01-05 MED ORDER — ATORVASTATIN CALCIUM 20 MG PO TABS: 20.0000 mg | ORAL_TABLET | Freq: Every day | ORAL | Status: DC

## 2015-01-08 ENCOUNTER — Other Ambulatory Visit: Payer: Self-pay

## 2015-01-08 MED ORDER — AMLODIPINE BESYLATE 5 MG PO TABS: 5.0000 mg | ORAL_TABLET | Freq: Every day | ORAL | Status: DC

## 2015-11-02 ENCOUNTER — Other Ambulatory Visit: Payer: Self-pay | Admitting: Interventional Cardiology

## 2016-01-11 ENCOUNTER — Other Ambulatory Visit: Payer: Self-pay | Admitting: *Deleted

## 2016-01-11 ENCOUNTER — Other Ambulatory Visit: Payer: Self-pay | Admitting: Interventional Cardiology

## 2016-01-13 ENCOUNTER — Other Ambulatory Visit: Payer: Self-pay | Admitting: *Deleted

## 2016-01-13 MED ORDER — AMLODIPINE BESYLATE 5 MG PO TABS: 5.0000 mg | ORAL_TABLET | Freq: Every day | ORAL | 0 refills | Status: DC

## 2016-01-14 ENCOUNTER — Other Ambulatory Visit: Payer: Self-pay | Admitting: *Deleted

## 2016-01-14 MED ORDER — METOPROLOL SUCCINATE ER 50 MG PO TB24: 50.0000 mg | ORAL_TABLET | Freq: Every day | ORAL | 0 refills | Status: DC

## 2016-01-14 MED ORDER — LISINOPRIL-HYDROCHLOROTHIAZIDE 20-12.5 MG PO TABS: 1.0000 | ORAL_TABLET | Freq: Every day | ORAL | 0 refills | Status: DC

## 2016-03-03 ENCOUNTER — Encounter: Payer: Self-pay | Admitting: Interventional Cardiology

## 2016-03-03 ENCOUNTER — Ambulatory Visit (INDEPENDENT_AMBULATORY_CARE_PROVIDER_SITE_OTHER): Payer: BLUE CROSS/BLUE SHIELD | Admitting: Interventional Cardiology

## 2016-03-03 ENCOUNTER — Encounter (INDEPENDENT_AMBULATORY_CARE_PROVIDER_SITE_OTHER): Payer: Self-pay

## 2016-03-03 ENCOUNTER — Other Ambulatory Visit: Payer: BLUE CROSS/BLUE SHIELD

## 2016-03-03 DIAGNOSIS — I2510 Atherosclerotic heart disease of native coronary artery without angina pectoris: Secondary | ICD-10-CM

## 2016-03-03 DIAGNOSIS — I10 Essential (primary) hypertension: Secondary | ICD-10-CM

## 2016-03-03 DIAGNOSIS — E785 Hyperlipidemia, unspecified: Secondary | ICD-10-CM | POA: Diagnosis not present

## 2016-03-03 LAB — LIPID PANEL
CHOL/HDL RATIO: 4.8 ratio (ref <=5.0)
CHOLESTEROL: 232 mg/dL — AB (ref 125–200)
HDL: 48 mg/dL (ref >=46)
LDL Cholesterol: 160 mg/dL — ABNORMAL HIGH (ref <130)
TRIGLYCERIDES: 119 mg/dL (ref <150)
VLDL: 24 mg/dL (ref <30)

## 2016-03-03 LAB — COMPREHENSIVE METABOLIC PANEL
ALBUMIN: 4.4 g/dL (ref 3.6–5.1)
ALK PHOS: 47 U/L (ref 33–130)
ALT: 10 U/L (ref 6–29)
AST: 14 U/L (ref 10–35)
BILIRUBIN TOTAL: 0.5 mg/dL (ref 0.2–1.2)
BUN: 11 mg/dL (ref 7–25)
CALCIUM: 9.6 mg/dL (ref 8.6–10.4)
CO2: 24 mmol/L (ref 20–31)
Chloride: 103 mmol/L (ref 98–110)
Creat: 0.62 mg/dL (ref 0.50–1.05)
GLUCOSE: 102 mg/dL — AB (ref 65–99)
POTASSIUM: 4.2 mmol/L (ref 3.5–5.3)
Sodium: 137 mmol/L (ref 135–146)
Total Protein: 7.1 g/dL (ref 6.1–8.1)

## 2016-03-03 NOTE — Patient Instructions (Signed)
Medication Instructions:  Your physician recommends that you continue on your current medications as directed. Please refer to the Current Medication list given to you today.   Labwork: Lipid and Cmet today   Testing/Procedures: None ordered  Follow-Up: Your physician wants you to follow-up in: 1 year with Dr.Smith You will receive a reminder letter in the mail two months in advance. If you don't receive a letter, please call our office to schedule the follow-up appointment.   Any Other Special Instructions Will Be Listed Below (If Applicable).     If you need a refill on your cardiac medications before your next appointment, please call your pharmacy.

## 2016-03-03 NOTE — Progress Notes (Signed)
Cardiology Office Note    Date:  03/03/2016   ID:  [name], DOB Aug 20, 1960, MRN [id number]  PCP:  No PCP Per Patient  Cardiologist: Lesleigh Noe, MD   Chief Complaint  Patient presents with  . Coronary Artery Disease    History of Present Illness:  [name] is a 55 y.o. female who presents for CAD (posterior infarct treated with overlapping bare-metal stents in distal circumflex 2009), hypertension, and hyperlipidemia  She is doing well. No cardiopulmonary complaints. She has not smoked cigarettes since her myocardial infarction 8 years ago. She has difficulty remembering to take her statin since remove the dosing tonight time. She has not been on it for quite some time. Lipids will be drawn today. She denies claudication. She has not had syncope. No neurological complaints.   Past Medical History:  Diagnosis Date  . CAD in native artery 09/22/2013   Bare-metal stent, circumflex, 2009   . Essential hypertension 09/22/2013  . Hyperlipidemia 09/22/2013    No past surgical history on file.  Current Medications: Outpatient Medications Prior to Visit  Medication Sig Dispense Refill  . amLODipine (NORVASC) 5 MG tablet Take 1 tablet (5 mg total) by mouth daily. 30 tablet 0  . aspirin 81 MG tablet Take 81 mg by mouth daily.    Marland Kitchen atorvastatin (LIPITOR) 20 MG tablet Take 1 tablet by mouth  daily 90 tablet 0  . lisinopril-hydrochlorothiazide (PRINZIDE,ZESTORETIC) 20-12.5 MG tablet Take 1 tablet by mouth daily. 90 tablet 0  . metoprolol succinate (TOPROL-XL) 50 MG 24 hr tablet Take 1 tablet (50 mg total) by mouth daily. Take with or immediately following a meal. 90 tablet 0   No facility-administered medications prior to visit.      Allergies:   Sulfa antibiotics   Social History   Social History  . Marital status: Single    Spouse name: N/A  . Number of children: N/A  . Years of education: N/A   Social History Main Topics  . Smoking status: Former Smoker    Packs/day: 1.00    Quit date: 09/04/2007  . Smokeless tobacco: Never Used  . Alcohol use Yes  . Drug use: Unknown  . Sexual activity: Not Asked   Other Topics Concern  . None   Social History Narrative  . None     Family History:  The patient's family history includes Aneurysm in her mother; Cancer in her father; Hypertension in her brother, brother, and sister.   ROS:   Please see the history of present illness.    No complaints other than not taking statin therapy because of nighttime dosing and being unable to remember  All other systems reviewed and are negative.   PHYSICAL EXAM:   VS:  BP 126/80   Pulse 71   Ht 5\' 1"  (1.549 m)   Wt 115 lb 9.6 oz (52.4 kg)   BMI 21.84 kg/m    GEN: Well nourished, well developed, in no acute distress  HEENT: normal  Neck: no JVD, carotid bruits, or masses Cardiac: RRR; no murmurs, rubs, or gallops,no edema  Respiratory:  clear to auscultation bilaterally, normal work of breathing GI: soft, nontender, nondistended, + BS MS: no deformity or atrophy  Skin: warm and dry, no rash Neuro:  Alert and Oriented x 3, Strength and sensation are intact Psych: euthymic mood, full affect  Wt Readings from Last 3 Encounters:  03/03/16 115 lb 9.6 oz (52.4 kg)  12/03/14 116 lb 1.9 oz (52.7 kg)  09/22/13 113 lb (51.3 kg)      Studies/Labs Reviewed:   EKG:  EKG Reveals sinus rhythm at 71 bpm old inferior infarct with barely significant Q waves and T-wave inversion.  Recent Labs: No results found for requested labs within last 8760 hours.   Lipid Panel    Component Value Date/Time   CHOL 168 12/03/2014 0849   TRIG 74.0 12/03/2014 0849   HDL 56.10 12/03/2014 0849   CHOLHDL 3 12/03/2014 0849   VLDL 14.8 12/03/2014 0849   LDLCALC 97 12/03/2014 0849    Additional studies/ records that were reviewed today include:   Her last lipid panel was relatively good with an LDL of 97. I believe she was on statin therapy at that time.  ASSESSMENT:      1. CAD in native artery   2. Essential hypertension   3. Hyperlipidemia      PLAN:  In order of problems listed above:  1. Asymptomatic with reference to coronary disease. Remains active. No functional testing. We will consider stress test next year. 2. Excellent blood pressure control. 2 g sodium diet. 3. Lipids are being drawn today. She is not on statin therapy. We will determine if 5 statin therapy is needed, I believe it will be. We will have to give her permission to take the statin whenever she can remember to take it and if it ends up being each morning, that would be preferable to not taking it at all.    Medication Adjustments/Labs and Tests Ordered: Current medicines are reviewed at length with the patient today.  Concerns regarding medicines are outlined above.  Medication changes, Labs and Tests ordered today are listed in the Patient Instructions below. Patient Instructions  Medication Instructions:  Your physician recommends that you continue on your current medications as directed. Please refer to the Current Medication list given to you today.   Labwork: Lipid and Cmet today   Testing/Procedures: None ordered  Follow-Up: Your physician wants you to follow-up in: 1 year with Dr.Gerardine Peltz You will receive a reminder letter in the mail two months in advance. If you don't receive a letter, please call our office to schedule the follow-up appointment.   Any Other Special Instructions Will Be Listed Below (If Applicable).     If you need a refill on your cardiac medications before your next appointment, please call your pharmacy.      Signed, Lesleigh NoeHenry W Elica Almas III, MD  03/03/2016 10:01 AM    [facility] [address], [place], [place]  [id number] Phone: [phone]; Fax: [phone]

## 2016-03-06 ENCOUNTER — Other Ambulatory Visit: Payer: Self-pay | Admitting: Interventional Cardiology

## 2016-03-06 ENCOUNTER — Telehealth: Payer: Self-pay | Admitting: Interventional Cardiology

## 2016-03-06 DIAGNOSIS — E785 Hyperlipidemia, unspecified: Secondary | ICD-10-CM

## 2016-03-06 NOTE — Telephone Encounter (Signed)
Mrs. [name] is returning a call about her results

## 2016-03-06 NOTE — Telephone Encounter (Signed)
Informed pt of lab results.  Pt's chart states Atorvastatin 20mg  QD and pt states that's what dosage she has at home.  Advised I will send message back to Dr. Katrinka BlazingSmith for clarification on dose he would like for pt to take.  Scheduled labs for 04/18/16.  Advised I will call back as soon as I get clarification.  Pt verbalized understanding and was appreciative for call.

## 2016-03-07 NOTE — Telephone Encounter (Signed)
20 mg is fine

## 2016-03-07 NOTE — Telephone Encounter (Signed)
Pt notified of response from Dr. Katrinka BlazingSmith

## 2016-04-18 ENCOUNTER — Other Ambulatory Visit: Payer: BLUE CROSS/BLUE SHIELD | Admitting: *Deleted

## 2016-04-18 DIAGNOSIS — E785 Hyperlipidemia, unspecified: Secondary | ICD-10-CM

## 2016-04-18 LAB — HEPATIC FUNCTION PANEL
ALT: 8 U/L (ref 6–29)
AST: 12 U/L (ref 10–35)
Albumin: 4.1 g/dL (ref 3.6–5.1)
Alkaline Phosphatase: 46 U/L (ref 33–130)
BILIRUBIN DIRECT: 0.1 mg/dL (ref <=0.2)
BILIRUBIN INDIRECT: 0.6 mg/dL (ref 0.2–1.2)
TOTAL PROTEIN: 6.7 g/dL (ref 6.1–8.1)
Total Bilirubin: 0.7 mg/dL (ref 0.2–1.2)

## 2016-04-18 LAB — LIPID PANEL
Cholesterol: 161 mg/dL (ref <200)
HDL: 50 mg/dL — ABNORMAL LOW (ref >50)
LDL CALC: 97 mg/dL (ref <100)
TRIGLYCERIDES: 69 mg/dL (ref <150)
Total CHOL/HDL Ratio: 3.2 Ratio (ref <5.0)
VLDL: 14 mg/dL (ref <30)

## 2016-04-22 ENCOUNTER — Other Ambulatory Visit: Payer: Self-pay | Admitting: Interventional Cardiology

## 2016-04-25 ENCOUNTER — Encounter: Payer: Self-pay | Admitting: *Deleted

## 2016-06-06 ENCOUNTER — Other Ambulatory Visit: Payer: Self-pay | Admitting: Interventional Cardiology

## 2016-12-19 ENCOUNTER — Telehealth: Payer: Self-pay | Admitting: Interventional Cardiology

## 2016-12-19 DIAGNOSIS — E785 Hyperlipidemia, unspecified: Secondary | ICD-10-CM

## 2016-12-19 DIAGNOSIS — I10 Essential (primary) hypertension: Secondary | ICD-10-CM

## 2016-12-19 DIAGNOSIS — I2510 Atherosclerotic heart disease of native coronary artery without angina pectoris: Secondary | ICD-10-CM

## 2016-12-19 NOTE — Telephone Encounter (Signed)
New message    Pt is calling about having lab work done before her appt with Dr. Katrinka BlazingSmith in October. Does she need labs?

## 2016-12-19 NOTE — Telephone Encounter (Signed)
Spoke with pt and made her aware that we did need to get fasting labs, CMET and Lipid on her.  Pt agreeable to come next Friday for labs.

## 2016-12-29 ENCOUNTER — Other Ambulatory Visit: Payer: BLUE CROSS/BLUE SHIELD

## 2017-01-02 ENCOUNTER — Other Ambulatory Visit: Payer: BLUE CROSS/BLUE SHIELD

## 2017-01-02 DIAGNOSIS — I10 Essential (primary) hypertension: Secondary | ICD-10-CM

## 2017-01-02 DIAGNOSIS — I2510 Atherosclerotic heart disease of native coronary artery without angina pectoris: Secondary | ICD-10-CM

## 2017-01-02 DIAGNOSIS — E785 Hyperlipidemia, unspecified: Secondary | ICD-10-CM | POA: Diagnosis not present

## 2017-01-02 LAB — COMPREHENSIVE METABOLIC PANEL
ALK PHOS: 56 IU/L (ref 39–117)
ALT: 7 IU/L (ref 0–32)
AST: 14 IU/L (ref 0–40)
Albumin/Globulin Ratio: 1.8 (ref 1.2–2.2)
Albumin: 4.2 g/dL (ref 3.5–5.5)
BUN/Creatinine Ratio: 16 (ref 9–23)
BUN: 8 mg/dL (ref 6–24)
Bilirubin Total: 0.3 mg/dL (ref 0.0–1.2)
CALCIUM: 8.7 mg/dL (ref 8.7–10.2)
CO2: 21 mmol/L (ref 20–29)
CREATININE: 0.51 mg/dL — AB (ref 0.57–1.00)
Chloride: 103 mmol/L (ref 96–106)
GFR calc Af Amer: 126 mL/min/{1.73_m2} (ref >59)
GFR, EST NON AFRICAN AMERICAN: 109 mL/min/{1.73_m2} (ref >59)
GLOBULIN, TOTAL: 2.4 g/dL (ref 1.5–4.5)
GLUCOSE: 101 mg/dL — AB (ref 65–99)
Potassium: 4.4 mmol/L (ref 3.5–5.2)
SODIUM: 139 mmol/L (ref 134–144)
Total Protein: 6.6 g/dL (ref 6.0–8.5)

## 2017-01-02 LAB — LIPID PANEL
CHOLESTEROL TOTAL: 141 mg/dL (ref 100–199)
Chol/HDL Ratio: 2.9 ratio (ref 0.0–4.4)
HDL: 48 mg/dL (ref >39)
LDL CALC: 73 mg/dL (ref 0–99)
TRIGLYCERIDES: 100 mg/dL (ref 0–149)
VLDL Cholesterol Cal: 20 mg/dL (ref 5–40)

## 2017-01-04 ENCOUNTER — Telehealth: Payer: Self-pay | Admitting: Interventional Cardiology

## 2017-01-04 NOTE — Telephone Encounter (Signed)
Follow Up: ° ° ° °Returning your call, concerning her lab results. °

## 2017-01-13 ENCOUNTER — Encounter (HOSPITAL_COMMUNITY): Payer: Self-pay | Admitting: Emergency Medicine

## 2017-01-13 ENCOUNTER — Ambulatory Visit (HOSPITAL_COMMUNITY)
Admission: EM | Admit: 2017-01-13 | Discharge: 2017-01-13 | Disposition: A | Discharge status: Home / Self Care | Payer: BLUE CROSS/BLUE SHIELD | Attending: Family Medicine | Admitting: Family Medicine

## 2017-01-13 DIAGNOSIS — M542 Cervicalgia: Secondary | ICD-10-CM | POA: Diagnosis not present

## 2017-01-13 DIAGNOSIS — S161XXA Strain of muscle, fascia and tendon at neck level, initial encounter: Secondary | ICD-10-CM

## 2017-01-13 NOTE — ED Provider Notes (Signed)
  [facility]   [id number] 01/13/17 Arrival Time: 1207  ASSESSMENT & PLAN:  1. Neck discomfort   2. Strain of neck muscle, initial encounter    Prefers OTC Aleve BID with food. Will f/u if not improving over the next week. Reviewed expectations re: course of current medical issues. Questions answered. Outlined signs and symptoms indicating need for more acute intervention. Patient verbalized understanding. After Visit Summary given.   SUBJECTIVE:  Willodean RosenthalKathryn Ellsworth is a 56 y.o. female who presents with complaint of L-sided neck soreness for the past one week. No trauma or injury reported. Fairly gradual onset. Occasionally radiates to L shoulder. No specific shoulder pain. No UE sensation changes or weakness. She is L handed. Neck discomfort worse when she turns her head to the left. No headaches or visual changes. No OTC treatment. Discomfort has not limited her.  ROS: As per HPI.   OBJECTIVE:  Vitals:   01/13/17 1237  BP: (!) 146/85  Pulse: 68  Resp: 16  Temp: 98.3 F (36.8 C)  TempSrc: Oral  SpO2: 99%     General appearance: alert; no distress HEENT: normocephalic; atraumatic Neck: supple without midline tenderness; mild and poorly localized lateral L neck discomfort with palpation; FROM Lungs: normal respirations Extremities: L shoulder with FROM; normal strength Skin: warm and dry Psychological:  alert and cooperative; normal mood and affect   Allergies  Allergen Reactions  . Sulfa Antibiotics Rash    PMHx, SurgHx, SocialHx, Medications, and Allergies were reviewed in the Visit Navigator and updated as appropriate.      Mardella LaymanHagler, Tru Leopard, MD 01/13/17 1311

## 2017-01-13 NOTE — ED Triage Notes (Signed)
[facility] a [facility] left shoulder pain that gets worse when she moves her neck. The patient reported the pain x 1 week and no known injury.

## 2017-01-13 NOTE — Discharge Instructions (Signed)
You may use OTC Aleve twice a day for the next week. Take with food. If not improving you may f/u here or with an orthopaedist.

## 2017-01-17 ENCOUNTER — Emergency Department (HOSPITAL_COMMUNITY): Payer: BLUE CROSS/BLUE SHIELD

## 2017-01-17 ENCOUNTER — Emergency Department (HOSPITAL_COMMUNITY)
Admission: EM | Admit: 2017-01-17 | Discharge: 2017-01-17 | Disposition: A | Discharge status: Home / Self Care | Payer: BLUE CROSS/BLUE SHIELD | Attending: Emergency Medicine | Admitting: Emergency Medicine

## 2017-01-17 ENCOUNTER — Encounter (HOSPITAL_COMMUNITY): Payer: Self-pay | Admitting: *Deleted

## 2017-01-17 DIAGNOSIS — M436 Torticollis: Secondary | ICD-10-CM

## 2017-01-17 DIAGNOSIS — I259 Chronic ischemic heart disease, unspecified: Secondary | ICD-10-CM | POA: Diagnosis not present

## 2017-01-17 DIAGNOSIS — M25512 Pain in left shoulder: Secondary | ICD-10-CM

## 2017-01-17 DIAGNOSIS — M542 Cervicalgia: Secondary | ICD-10-CM | POA: Diagnosis not present

## 2017-01-17 DIAGNOSIS — I10 Essential (primary) hypertension: Secondary | ICD-10-CM | POA: Insufficient documentation

## 2017-01-17 DIAGNOSIS — Z87891 Personal history of nicotine dependence: Secondary | ICD-10-CM | POA: Diagnosis not present

## 2017-01-17 MED ORDER — METHOCARBAMOL 500 MG PO TABS: 500.0000 mg | ORAL_TABLET | Freq: Two times a day (BID) | ORAL | 0 refills | Status: DC

## 2017-01-17 NOTE — ED Notes (Signed)
Patient transported to X-ray 

## 2017-01-17 NOTE — ED Notes (Signed)
Patient returned from X-ray 

## 2017-01-17 NOTE — ED Triage Notes (Signed)
PT was seen at [facility] last week for L neck pain that increases with movement.  Has been taking aleve as prescribed, but pain is increasing.  Only relief from pain is when she lays down.

## 2017-01-17 NOTE — Discharge Instructions (Signed)
Please read attached information regarding your condition. Apply heat as directed to affected area for 20 minutes at a time. Take Robaxin in addition to Aleve as directed. Return to ED for worsening pain, fevers, trouble moving neck, injuries, severe headaches, vomiting.

## 2017-01-17 NOTE — ED Provider Notes (Signed)
[facility] Note   CSN: [id number] Arrival date & time: 01/17/17  4098     History   Chief Complaint Chief Complaint  Patient presents with  . Neck Pain  . Shoulder Pain    HPI [name] is a 56 y.o. female.  HPI Patient, with a past medical history of HTN, CAD, presents to ED for evaluation of left-sided neck and shoulder pain that began 2 weeks ago. Pain worsens with looking to the right and improved when laying down. Denies any previous history of similar symptoms. She states that she was seen at an urgent care last week and has been taking the Aleve as prescribed, but the pain has not improved. She was also told to apply ice to the area.  She denies any chest pain, fever, chills, shortness of breath, injury, accident, falls, nausea, vomiting, prior back surgery, vision changes, headache.  Past Medical History:  Diagnosis Date  . CAD in native artery 09/22/2013   Bare-metal stent, circumflex, 2009   . Essential hypertension 09/22/2013  . Hyperlipidemia 09/22/2013    Patient Active Problem List   Diagnosis Date Noted  . CAD in native artery 09/22/2013  . Hyperlipidemia 09/22/2013  . Essential hypertension 09/22/2013    History reviewed. No pertinent surgical history.  OB History    No data available       Home Medications    Prior to Admission medications   Medication Sig Start Date End Date Taking? Authorizing Provider  amLODipine (NORVASC) 5 MG tablet TAKE 1 TABLET BY MOUTH  DAILY 04/24/16   Lyn Records, MD  aspirin 81 MG tablet Take 81 mg by mouth daily.    [provider]  atorvastatin (LIPITOR) 20 MG tablet TAKE 1 TABLET BY MOUTH  DAILY 03/07/16   Lyn Records, MD  lisinopril-hydrochlorothiazide (PRINZIDE,ZESTORETIC) 20-12.5 MG tablet TAKE 1 TABLET BY MOUTH  DAILY 06/06/16   Lyn Records, MD  methocarbamol (ROBAXIN) 500 MG tablet Take 1 tablet (500 mg total) by mouth 2 (two) times daily. 01/17/17   Laurren Lepkowski, PA-C    metoprolol succinate (TOPROL-XL) 50 MG 24 hr tablet TAKE 1 TABLET BY MOUTH  DAILY WITH OR IMMEDIATLEY  FOLLOWING A MEAL 06/06/16   Lyn Records, MD    Family History Family History  Problem Relation Age of Onset  . Cancer Father        LIVER  . Aneurysm Mother   . Hypertension Sister   . Hypertension Brother   . Hypertension Brother     Social History Social History  Substance Use Topics  . Smoking status: Former Smoker    Packs/day: 1.00    Quit date: 09/04/2007  . Smokeless tobacco: Never Used  . Alcohol use Yes     Comment: occ     Allergies   Sulfa antibiotics   Review of Systems Review of Systems  Constitutional: Negative for chills and fever.  HENT: Negative for facial swelling.   Eyes: Negative for visual disturbance.  Respiratory: Negative for shortness of breath.   Cardiovascular: Negative for chest pain.  Gastrointestinal: Negative for nausea and vomiting.  Musculoskeletal: Positive for myalgias and neck pain. Negative for arthralgias and neck stiffness.  Skin: Negative for rash.  Neurological: Negative for dizziness, weakness, numbness and headaches.     Physical Exam Updated Vital Signs BP 118/80   Pulse 63   Temp 98.1 F (36.7 C) (Oral)   Resp 18   Ht 5\' 1"  (1.549 m)  Wt 50.3 kg (111 lb)   LMP 01/11/2017   SpO2 100%   BMI 20.97 kg/m   Physical Exam  Constitutional: She appears well-developed and well-nourished. No distress.  Pleasant, nontoxic appearing and in no acute distress. Resting comfortably on chair.  HENT:  Head: Normocephalic and atraumatic.  Eyes: Conjunctivae and EOM are normal. No scleral icterus.  Neck: Normal range of motion. Neck supple.  Tenderness to palpation in the indicated area. No meningismus. No midline spinal tenderness present in lumbar, thoracic or cervical spine. No step-off palpated. No visible bruising, edema or temperature change noted. 2+ radial pulses bilaterally. Sensation intact to light touch.   Cardiovascular: Normal rate, regular rhythm and normal heart sounds.   Pulmonary/Chest: Effort normal and breath sounds normal. No respiratory distress.  Musculoskeletal: Normal range of motion. She exhibits tenderness. She exhibits no edema or deformity.       Back:  Lymphadenopathy:    She has no cervical adenopathy.  Neurological: She is alert.  Skin: No rash noted. She is not diaphoretic.  Psychiatric: She has a normal mood and affect.  Nursing note and vitals reviewed.    ED Treatments / Results  Labs (all labs ordered are listed, but only abnormal results are displayed) Labs Reviewed - No data to display  EKG  EKG Interpretation None       Radiology Dg Cervical Spine Complete  Result Date: 01/17/2017 CLINICAL DATA:  Posterior left neck pain for 2 weeks. No known injury. EXAM: CERVICAL SPINE - COMPLETE 4+ VIEW COMPARISON:  None. FINDINGS: There is no fracture or malalignment. Straightening of lordosis is noted. Loss of disc space height appears worst at C3-4 and C5-6. Prevertebral soft tissues are normal appearance. Carotid atherosclerosis is noted. Lung apices are clear. IMPRESSION: No acute finding. Degenerative disc disease C3-4 and C5-6. Carotid atherosclerosis. Electronically Signed   By: Drusilla Kannerhomas  Dalessio M.D.   On: 01/17/2017 10:35    Procedures Procedures (including critical care time)  Medications Ordered in ED Medications - No data to display   Initial Impression / Assessment and Plan / ED Course  I have reviewed the triage vital signs and the nursing notes.  Pertinent labs & imaging results that were available during my care of the patient were reviewed by me and considered in my medical decision making (see chart for details).     Patient presents to ED for evaluation of left-sided neck pain and shoulder pain that began 2 weeks ago. States not much improvement in symptoms with Aleve twice daily which she was prescribed at urgent care last week. She was  also told to apply ice to the area. She denies any injury, falls, accidents, fever, chills, headache, rashes, nausea, vomiting. On physical exam there is tenderness to palpation along the left shoulder. There is no midline C-spine tenderness present. There are no meningeal signs present. She is afebrile with no history of fever. Normal BP, HR, RR, SpO2. I have low suspicion for meningitis or other infectious cause being the cause of her neck pain. We'll obtain x-rays of C-spine and reassess after results.  1040: X-rays of C-spine returned as negative for acute finding. Did show degenerative disc disease at several areas. Symptoms likely due to a muscle strain in the area. We will give muscle relaxer in addition to anti-inflammatory and advised to use heat instead of ice in the area and stretch as tolerated for maximum efficacy. Will give patient instructions on stretching and heat therapy. Patient appears stable for discharge at  this time. Strict return precautions given.  Final Clinical Impressions(s) / ED Diagnoses   Final diagnoses:  Torticollis  Acute pain of left shoulder    New Prescriptions New Prescriptions   METHOCARBAMOL (ROBAXIN) 500 MG TABLET    Take 1 tablet (500 mg total) by mouth 2 (two) times daily.     Dietrich Pates, PA-C 01/17/17 1047    Arby Barrette, MD 01/25/17 [phone]

## 2017-03-05 ENCOUNTER — Ambulatory Visit (INDEPENDENT_AMBULATORY_CARE_PROVIDER_SITE_OTHER): Payer: BLUE CROSS/BLUE SHIELD | Admitting: Interventional Cardiology

## 2017-03-05 ENCOUNTER — Encounter (INDEPENDENT_AMBULATORY_CARE_PROVIDER_SITE_OTHER): Payer: Self-pay

## 2017-03-05 ENCOUNTER — Encounter: Payer: Self-pay | Admitting: Interventional Cardiology

## 2017-03-05 VITALS — BP 130/84 | HR 62 | Ht 62.0 in | Wt 117.1 lb

## 2017-03-05 DIAGNOSIS — E785 Hyperlipidemia, unspecified: Secondary | ICD-10-CM | POA: Diagnosis not present

## 2017-03-05 DIAGNOSIS — I10 Essential (primary) hypertension: Secondary | ICD-10-CM

## 2017-03-05 DIAGNOSIS — I2510 Atherosclerotic heart disease of native coronary artery without angina pectoris: Secondary | ICD-10-CM | POA: Diagnosis not present

## 2017-03-05 NOTE — Patient Instructions (Signed)

## 2017-03-05 NOTE — Progress Notes (Signed)
Cardiology Office Note    Date:  03/05/2017   ID:  [name], DOB 31-Oct-1960, MRN [id number]  PCP:  System, Provider Not In  Cardiologist: Lesleigh Noe, MD   Chief Complaint  Patient presents with  . Coronary Artery Disease    History of Present Illness:  [name] is a 56 y.o. female who presents for CAD (posterior infarct treated with overlapping bare-metal stents in distal circumflex 2009), hypertension, and hyperlipidemia   [name] is doing well. She had left shoulder and neck discomfort earlier this summer that has resolved. She was seen in urgent care and the emergency room in mid August. It was felt to be musculoskeletal/torticollis. No recurrence after using Aleve.  Past Medical History:  Diagnosis Date  . CAD in native artery 09/22/2013   Bare-metal stent, circumflex, 2009   . Essential hypertension 09/22/2013  . Hyperlipidemia 09/22/2013    No past surgical history on file.  Current Medications: Outpatient Medications Prior to Visit  Medication Sig Dispense Refill  . amLODipine (NORVASC) 5 MG tablet TAKE 1 TABLET BY MOUTH  DAILY 30 tablet 10  . aspirin 81 MG tablet Take 81 mg by mouth daily.    Marland Kitchen atorvastatin (LIPITOR) 20 MG tablet TAKE 1 TABLET BY MOUTH  DAILY 90 tablet 3  . lisinopril-hydrochlorothiazide (PRINZIDE,ZESTORETIC) 20-12.5 MG tablet TAKE 1 TABLET BY MOUTH  DAILY 90 tablet 2  . methocarbamol (ROBAXIN) 500 MG tablet Take 1 tablet (500 mg total) by mouth 2 (two) times daily. 20 tablet 0  . metoprolol succinate (TOPROL-XL) 50 MG 24 hr tablet TAKE 1 TABLET BY MOUTH  DAILY WITH OR IMMEDIATLEY  FOLLOWING A MEAL 90 tablet 2   No facility-administered medications prior to visit.      Allergies:   Sulfa antibiotics   Social History   Social History  . Marital status: Single    Spouse name: N/A  . Number of children: N/A  . Years of education: N/A   Social History Main Topics  . Smoking status: Former Smoker    Packs/day: 1.00    Quit  date: 09/04/2007  . Smokeless tobacco: Never Used  . Alcohol use Yes     Comment: occ  . Drug use: No  . Sexual activity: Not Asked   Other Topics Concern  . None   Social History Narrative  . None     Family History:  The patient's family history includes Aneurysm in her mother; Cancer in her father; Hypertension in her brother, brother, and sister.   ROS:   Please see the history of present illness.    Neck discomfort otherwise no complaints. She is compliant with lipid therapy.  All other systems reviewed and are negative.   PHYSICAL EXAM:   VS:  BP 130/84 (BP Location: Left Arm)   Pulse 62   Ht  (1.575 m)   Wt 117 lb 1.9 oz (53.1 kg)   BMI 21.42 kg/m    GEN: Well nourished, well developed, in no acute distress  HEENT: normal  Neck: no JVD, carotid bruits, or masses Cardiac: RRR; no murmurs, rubs, or gallops,no edema  Respiratory:  clear to auscultation bilaterally, normal work of breathing GI: soft, nontender, nondistended, + BS MS: no deformity or atrophy  Skin: warm and dry, no rash Neuro:  Alert and Oriented x 3, Strength and sensation are intact Psych: euthymic mood, full affect  Wt Readings from Last 3 Encounters:  03/05/17 117 lb 1.9 oz (53.1 kg)  01/17/17  111 lb (50.3 kg)  03/03/16 115 lb 9.6 oz (52.4 kg)      Studies/Labs Reviewed:   EKG:  EKG  Normal sinus rhythm, nonspecific T-wave abnormality. Old inferior Q waves.  Recent Labs: 01/02/2017: ALT 7; BUN 8; Creatinine, Ser 0.51; Potassium 4.4; Sodium 139   Lipid Panel    Component Value Date/Time   CHOL 141 01/02/2017 0928   TRIG 100 01/02/2017 0928   HDL 48 01/02/2017 0928   CHOLHDL 2.9 01/02/2017 0928   CHOLHDL 3.2 04/18/2016 0831   VLDL 14 04/18/2016 0831   LDLCALC 73 01/02/2017 0928    Additional studies/ records that were reviewed today include:  No new data. I do not believe functional testing is indicated that she is very active and has no limitations.    ASSESSMENT:    1.  CAD in native artery   2. Essential hypertension   3. Hyperlipidemia, unspecified hyperlipidemia type      PLAN:  In order of problems listed above:  1. Stable without angina pectoris in the setting of a very active lifestyle. Continue aerobic activity. Report limitations. Clinical follow-up in one year. 2. Target blood pressure 130/85 mmHg a less. 3. LDL target of 70. Most recent LDL was 74 and July. Plan repeat liver and lipid panel in 12 months.  Clinical follow-up in one year. No change in therapy.    Medication Adjustments/Labs and Tests Ordered: Current medicines are reviewed at length with the patient today.  Concerns regarding medicines are outlined above.  Medication changes, Labs and Tests ordered today are listed in the Patient Instructions below. There are no Patient Instructions on file for this visit.   Signed, Lesleigh Noe, MD  03/05/2017 3:13 PM    [facility] [address] [place], [place], [place]  [address] Phone: [phone]; Fax: [phone]

## 2017-03-13 ENCOUNTER — Other Ambulatory Visit: Payer: Self-pay | Admitting: Interventional Cardiology

## 2017-05-02 ENCOUNTER — Other Ambulatory Visit: Payer: Self-pay

## 2017-05-02 MED ORDER — AMLODIPINE BESYLATE 5 MG PO TABS: 5.0000 mg | ORAL_TABLET | Freq: Every day | ORAL | 10 refills | Status: DC

## 2017-11-28 ENCOUNTER — Telehealth: Payer: Self-pay | Admitting: Interventional Cardiology

## 2017-11-28 DIAGNOSIS — I2510 Atherosclerotic heart disease of native coronary artery without angina pectoris: Secondary | ICD-10-CM

## 2017-11-28 DIAGNOSIS — E785 Hyperlipidemia, unspecified: Secondary | ICD-10-CM

## 2017-11-28 DIAGNOSIS — I10 Essential (primary) hypertension: Secondary | ICD-10-CM

## 2017-11-28 NOTE — Telephone Encounter (Signed)
Called and left message for [name] to call back.   [name] needs fasting labs in late July/early August.  Orders have been placed for Lipid, liver and BMET.  [name] also needs OV appt with Dr. Katrinka BlazingSmith in mid to late Oct.

## 2017-11-29 NOTE — Telephone Encounter (Signed)
Spoke with pt and scheduled her for labs on 7/30.  Pt scheduled to see Dr. Katrinka BlazingSmith in October.

## 2018-01-01 ENCOUNTER — Other Ambulatory Visit: Payer: BLUE CROSS/BLUE SHIELD

## 2018-01-01 DIAGNOSIS — I2510 Atherosclerotic heart disease of native coronary artery without angina pectoris: Secondary | ICD-10-CM

## 2018-01-01 DIAGNOSIS — E785 Hyperlipidemia, unspecified: Secondary | ICD-10-CM

## 2018-01-01 DIAGNOSIS — I10 Essential (primary) hypertension: Secondary | ICD-10-CM | POA: Diagnosis not present

## 2018-01-01 LAB — BASIC METABOLIC PANEL
BUN/Creatinine Ratio: 16 (ref 9–23)
BUN: 10 mg/dL (ref 6–24)
CO2: 23 mmol/L (ref 20–29)
CREATININE: 0.61 mg/dL (ref 0.57–1.00)
Calcium: 9.3 mg/dL (ref 8.7–10.2)
Chloride: 104 mmol/L (ref 96–106)
GFR calc Af Amer: 118 mL/min/{1.73_m2} (ref >59)
GFR, EST NON AFRICAN AMERICAN: 102 mL/min/{1.73_m2} (ref >59)
Glucose: 114 mg/dL — ABNORMAL HIGH (ref 65–99)
Potassium: 4.2 mmol/L (ref 3.5–5.2)
SODIUM: 142 mmol/L (ref 134–144)

## 2018-01-01 LAB — LIPID PANEL
Chol/HDL Ratio: 3.4 ratio (ref 0.0–4.4)
Cholesterol, Total: 166 mg/dL (ref 100–199)
HDL: 49 mg/dL (ref >39)
LDL Calculated: 106 mg/dL — ABNORMAL HIGH (ref 0–99)
Triglycerides: 55 mg/dL (ref 0–149)
VLDL CHOLESTEROL CAL: 11 mg/dL (ref 5–40)

## 2018-01-01 LAB — HEPATIC FUNCTION PANEL
ALBUMIN: 4.5 g/dL (ref 3.5–5.5)
ALK PHOS: 71 IU/L (ref 39–117)
ALT: 11 IU/L (ref 0–32)
AST: 11 IU/L (ref 0–40)
BILIRUBIN TOTAL: 0.3 mg/dL (ref 0.0–1.2)
BILIRUBIN, DIRECT: 0.07 mg/dL (ref 0.00–0.40)
TOTAL PROTEIN: 6.6 g/dL (ref 6.0–8.5)

## 2018-01-08 ENCOUNTER — Telehealth: Payer: Self-pay | Admitting: Interventional Cardiology

## 2018-01-08 DIAGNOSIS — Z79899 Other long term (current) drug therapy: Secondary | ICD-10-CM

## 2018-01-08 DIAGNOSIS — E785 Hyperlipidemia, unspecified: Secondary | ICD-10-CM

## 2018-01-08 MED ORDER — ATORVASTATIN CALCIUM 40 MG PO TABS: 40.0000 mg | ORAL_TABLET | Freq: Every day | ORAL | 3 refills | Status: DC

## 2018-01-08 NOTE — Telephone Encounter (Signed)
Spoke with patient and discussed the results, she accepted the dose change of atorvastatin to 40 mg. I advised healthy eating habits and exercise. The patient confirmed her appointment and accepted to going to liver and lipid  fasting labs before her appointment.     Notes recorded by Julio SicksBowers, Jennifer L, LPN on 1/47/82957/31/2019 at 11:51 AM EDT Left message to call back ------  Notes recorded by Lyn RecordsSmith, Henry W, MD on 01/01/2018 at 10:10 PM EDT Let the patient know LDL > 70, Please exercise, decrease fat and confirm statin therapy.Increase atorvastatin to 40 mg daily and recheck liver and lipid is 2-3 months. A copy will be sent to System, Provider Not In

## 2018-01-08 NOTE — Telephone Encounter (Signed)
New problem    Pt was returning call to nurse concerning her lab work. Please call pt.

## 2018-02-26 ENCOUNTER — Encounter: Payer: Self-pay | Admitting: Interventional Cardiology

## 2018-03-11 NOTE — Progress Notes (Signed)
Cardiology Office Note:    Date:  03/12/2018   ID:  [name], DOB 08/15/1960, MRN [id number]  PCP:  System, Provider Not In  Cardiologist:  No primary care provider on file.   Referring MD: No ref. provider found   Chief Complaint  Patient presents with  . Coronary Artery Disease  . Hypertension    History of Present Illness:    [name] is a 57 y.o. female with a hx of CAD (posterior infarct treated with overlapping bare-metal stents in distal circumflex 2009), hypertension, and hyperlipidemia.   Past Medical History:  Diagnosis Date  . CAD in native artery 09/22/2013   Bare-metal stent, circumflex, 2009   . Essential hypertension 09/22/2013  . Hyperlipidemia 09/22/2013    History reviewed. No pertinent surgical history.  Current Medications: Current Meds  Medication Sig  . aspirin 81 MG tablet Take 81 mg by mouth daily.  Marland Kitchen atorvastatin (LIPITOR) 40 MG tablet Take 1 tablet (40 mg total) by mouth daily.  Marland Kitchen lisinopril-hydrochlorothiazide (PRINZIDE,ZESTORETIC) 20-12.5 MG tablet TAKE 1 TABLET BY MOUTH  DAILY  . methocarbamol (ROBAXIN) 500 MG tablet Take 1 tablet (500 mg total) by mouth 2 (two) times daily.  . metoprolol succinate (TOPROL-XL) 50 MG 24 hr tablet TAKE 1 TABLET BY MOUTH  DAILY WITH OR IMMEDIATLEY  FOLLOWING A MEAL  . [DISCONTINUED] amLODipine (NORVASC) 5 MG tablet Take 1 tablet (5 mg total) by mouth daily.     Allergies:   Sulfa antibiotics   Social History   Socioeconomic History  . Marital status: Single    Spouse name: Not on file  . Number of children: Not on file  . Years of education: Not on file  . Highest education level: Not on file  Occupational History  . Not on file  Social Needs  . Financial resource strain: Not on file  . Food insecurity:    Worry: Not on file    Inability: Not on file  . Transportation needs:    Medical: Not on file    Non-medical: Not on file  Tobacco Use  . Smoking status: Former Smoker    Packs/day:  1.00    Last attempt to quit: 09/04/2007    Years since quitting: 10.5  . Smokeless tobacco: Never Used  Substance and Sexual Activity  . Alcohol use: Yes    Comment: occ  . Drug use: No  . Sexual activity: Not on file  Lifestyle  . Physical activity:    Days per week: Not on file    Minutes per session: Not on file  . Stress: Not on file  Relationships  . Social connections:    Talks on phone: Not on file    Gets together: Not on file    Attends religious service: Not on file    Active member of club or organization: Not on file    Attends meetings of clubs or organizations: Not on file    Relationship status: Not on file  Other Topics Concern  . Not on file  Social History Narrative  . Not on file     Family History: The patient's family history includes Aneurysm in her mother; Cancer in her father; Hypertension in her brother, brother, and sister.  ROS:   Please see the history of present illness.    No complaints other than a little right knee discomfort.  Cannot believe that she is 57 years old but states she is happy to be doing as well as she is.  All other systems reviewed and are negative.  EKGs/Labs/Other Studies Reviewed:    The following studies were reviewed today: No new cardiac data.  EKG:  EKG is  ordered today.  The ekg ordered today demonstrates sinus bradycardia  Recent Labs: 01/01/2018: ALT 11; BUN 10; Creatinine, Ser 0.61; Potassium 4.2; Sodium 142  Recent Lipid Panel    Component Value Date/Time   CHOL 166 01/01/2018 0955   TRIG 55 01/01/2018 0955   HDL 49 01/01/2018 0955   CHOLHDL 3.4 01/01/2018 0955   CHOLHDL 3.2 04/18/2016 0831   VLDL 14 04/18/2016 0831   LDLCALC 106 (H) 01/01/2018 0955    Physical Exam:    VS:  BP (!) 152/90   Pulse (!) 58   Ht 5\' 2"  (1.575 m)   Wt 121 lb 12.8 oz (55.2 kg)   BMI 22.28 kg/m     Wt Readings from Last 3 Encounters:  03/12/18 121 lb 12.8 oz (55.2 kg)  03/05/17 117 lb 1.9 oz (53.1 kg)  01/17/17 111  lb (50.3 kg)     GEN: Healthy.  Well nourished, well developed in no acute distress HEENT: Normal NECK: No JVD. LYMPHATICS: No lymphadenopathy CARDIAC: RRR, no murmur, no gallop, no edema. VASCULAR: 2+ bilateral radial pulses.  No bruits. RESPIRATORY:  Clear to auscultation without rales, wheezing or rhonchi  ABDOMEN: Soft, non-tender, non-distended, No pulsatile mass, MUSCULOSKELETAL: No deformity  SKIN: Warm and dry NEUROLOGIC:  Alert and oriented x 3 PSYCHIATRIC:  Normal affect   ASSESSMENT:    1. CAD in native artery   2. Hyperlipidemia, unspecified hyperlipidemia type   3. Essential hypertension    PLAN:    In order of problems listed above:  1. A stress nuclear study should be done this year to assess for progression of disease as she is now greater than 10 years post PCI. 2. LDL target less than 70.  Most recent LDL was 106.  Statin therapy was doubled going from 20 mg of atorvastatin to 40 mg daily.  She has not yet had repeat lipids.  If we do not hit the target of 70 on 40 mg will need to increase to 80 mg daily. 3. Blood pressure is elevated.  Increase amlodipine to 10 mg daily.  Watch for development of lower extremity edema.  Continue lisinopril HCT and metoprolol as currently being given.  Stress Myoview to rule out progression of native disease.  No invasive evaluation since she is asymptomatic unless we see evidence of high risk underlying substrate.  Stress test will also help evaluate blood pressure control on titrated therapy.   Medication Adjustments/Labs and Tests Ordered: Current medicines are reviewed at length with the patient today.  Concerns regarding medicines are outlined above.  Orders Placed This Encounter  Procedures  . Lipid Profile  . Hepatic function panel  . MYOCARDIAL PERFUSION IMAGING  . EKG 12-Lead   Meds ordered this encounter  Medications  . amLODipine (NORVASC) 10 MG tablet    Sig: Take 1 tablet (10 mg total) by mouth daily.     Dispense:  90 tablet    Refill:  3    Patient Instructions  Medication Instructions:  1) Increase Amlodipine to 10mg  once daily.  Please monitor for ankle swelling and contact this office if this occurs.  If you need a refill on your cardiac medications before your next appointment, please call your pharmacy.   Lab work: Your physician recommends that you return for lab work in: 4 weeks (Lipid,  liver).  Please come fasting for this lab work.  If you have labs (blood work) drawn today and your tests are completely normal, you will receive your results only by: Marland Kitchen MyChart Message (if you have MyChart) OR . A paper copy in the mail If you have any lab test that is abnormal or we need to change your treatment, we will call you to review the results.  Testing/Procedures: Your physician has requested that you have en exercise stress myoview on the same day as your labs. For further information please visit [email or web address] Please follow instruction sheet, as given.    Follow-Up: At [facility], you and your health needs are our priority.  As part of our continuing mission to provide you with exceptional heart care, we have created designated Provider Care Teams.  These Care Teams include your primary Cardiologist (physician) and Advanced Practice Providers (APPs -  Physician Assistants and Nurse Practitioners) who all work together to provide you with the care you need, when you need it. You will need a follow up appointment in 1 years.  Please call our office 2 months in advance to schedule this appointment.  You may see Dr. Katrinka Blazing or one of the following Advanced Practice Providers on your designated Care Team:   Norma Fredrickson, NP Nada Boozer, NP . Georgie Chard, NP  Any Other Special Instructions Will Be Listed Below (If Applicable).       Signed, Lesleigh Noe, MD  03/12/2018 11:10 AM    [facility]

## 2018-03-12 ENCOUNTER — Ambulatory Visit (INDEPENDENT_AMBULATORY_CARE_PROVIDER_SITE_OTHER): Payer: BLUE CROSS/BLUE SHIELD | Admitting: Interventional Cardiology

## 2018-03-12 ENCOUNTER — Other Ambulatory Visit: Payer: BLUE CROSS/BLUE SHIELD

## 2018-03-12 ENCOUNTER — Encounter: Payer: Self-pay | Admitting: Interventional Cardiology

## 2018-03-12 ENCOUNTER — Encounter: Payer: Self-pay | Admitting: *Deleted

## 2018-03-12 VITALS — BP 152/90 | HR 58 | Ht 62.0 in | Wt 121.8 lb

## 2018-03-12 DIAGNOSIS — I2510 Atherosclerotic heart disease of native coronary artery without angina pectoris: Secondary | ICD-10-CM

## 2018-03-12 DIAGNOSIS — E785 Hyperlipidemia, unspecified: Secondary | ICD-10-CM

## 2018-03-12 DIAGNOSIS — I10 Essential (primary) hypertension: Secondary | ICD-10-CM

## 2018-03-12 MED ORDER — AMLODIPINE BESYLATE 10 MG PO TABS: 10.0000 mg | ORAL_TABLET | Freq: Every day | ORAL | 3 refills | Status: DC

## 2018-03-12 NOTE — Patient Instructions (Signed)
Medication Instructions:  1) Increase Amlodipine to 10mg  once daily.  Please monitor for ankle swelling and contact this office if this occurs.  If you need a refill on your cardiac medications before your next appointment, please call your pharmacy.   Lab work: Your physician recommends that you return for lab work in: 4 weeks (Lipid, liver).  Please come fasting for this lab work.  If you have labs (blood work) drawn today and your tests are completely normal, you will receive your results only by: Marland Kitchen MyChart Message (if you have MyChart) OR . A paper copy in the mail If you have any lab test that is abnormal or we need to change your treatment, we will call you to review the results.  Testing/Procedures: Your physician has requested that you have en exercise stress myoview on the same day as your labs. For further information please visit [email or web address] Please follow instruction sheet, as given.    Follow-Up: At [facility], you and your health needs are our priority.  As part of our continuing mission to provide you with exceptional heart care, we have created designated Provider Care Teams.  These Care Teams include your primary Cardiologist (physician) and Advanced Practice Providers (APPs -  Physician Assistants and Nurse Practitioners) who all work together to provide you with the care you need, when you need it. You will need a follow up appointment in 1 years.  Please call our office 2 months in advance to schedule this appointment.  You may see Dr. Katrinka Blazing or one of the following Advanced Practice Providers on your designated Care Team:   Norma Fredrickson, NP Nada Boozer, NP . Georgie Chard, NP  Any Other Special Instructions Will Be Listed Below (If Applicable).

## 2018-04-04 ENCOUNTER — Telehealth (HOSPITAL_COMMUNITY): Payer: Self-pay | Admitting: *Deleted

## 2018-04-04 NOTE — Telephone Encounter (Signed)
Attempted to call patient regarding upcoming appointment- no answer, unable to leave a message.  [name] Jacqueline  

## 2018-04-09 ENCOUNTER — Other Ambulatory Visit: Payer: Self-pay | Admitting: Interventional Cardiology

## 2018-04-09 ENCOUNTER — Other Ambulatory Visit: Payer: BLUE CROSS/BLUE SHIELD

## 2018-04-09 ENCOUNTER — Encounter (HOSPITAL_COMMUNITY): Payer: BLUE CROSS/BLUE SHIELD

## 2018-04-09 DIAGNOSIS — I10 Essential (primary) hypertension: Secondary | ICD-10-CM

## 2018-04-09 DIAGNOSIS — E785 Hyperlipidemia, unspecified: Secondary | ICD-10-CM | POA: Diagnosis not present

## 2018-04-09 DIAGNOSIS — I2510 Atherosclerotic heart disease of native coronary artery without angina pectoris: Secondary | ICD-10-CM

## 2018-04-09 LAB — HEPATIC FUNCTION PANEL
ALBUMIN: 4.5 g/dL (ref 3.5–5.5)
ALK PHOS: 78 IU/L (ref 39–117)
ALT: 14 IU/L (ref 0–32)
AST: 14 IU/L (ref 0–40)
BILIRUBIN, DIRECT: 0.1 mg/dL (ref 0.00–0.40)
Bilirubin Total: 0.3 mg/dL (ref 0.0–1.2)
Total Protein: 6.7 g/dL (ref 6.0–8.5)

## 2018-04-09 LAB — LIPID PANEL
CHOLESTEROL TOTAL: 162 mg/dL (ref 100–199)
Chol/HDL Ratio: 2.8 ratio (ref 0.0–4.4)
HDL: 57 mg/dL (ref >39)
LDL Calculated: 93 mg/dL (ref 0–99)
Triglycerides: 60 mg/dL (ref 0–149)
VLDL CHOLESTEROL CAL: 12 mg/dL (ref 5–40)

## 2018-04-11 ENCOUNTER — Telehealth: Payer: Self-pay | Admitting: *Deleted

## 2018-04-11 NOTE — Telephone Encounter (Signed)
-----   Message from Lyn Records, MD sent at 04/09/2018 10:47 PM EST ----- Let the patient know labs are okay and lipids better but not ideal. Exercise/low fat diet. Recheck liver and lipid at next yearly appointment. A copy will be sent to System, Provider Not In

## 2018-04-11 NOTE — Telephone Encounter (Signed)
Left message to call back  

## 2018-04-16 NOTE — Telephone Encounter (Signed)
Informed pt of results. Pt verbalized understanding. 

## 2018-04-18 ENCOUNTER — Telehealth (HOSPITAL_COMMUNITY): Payer: Self-pay | Admitting: *Deleted

## 2018-04-18 NOTE — Telephone Encounter (Signed)
Left message on voicemail in reference to upcoming appointment scheduled for 04/23/18 Phone number given for a call back so details instructions can be given. [name], [name]

## 2018-04-23 ENCOUNTER — Ambulatory Visit (HOSPITAL_COMMUNITY): Payer: BLUE CROSS/BLUE SHIELD | Attending: Cardiovascular Disease

## 2018-04-23 DIAGNOSIS — I2510 Atherosclerotic heart disease of native coronary artery without angina pectoris: Secondary | ICD-10-CM | POA: Diagnosis not present

## 2018-04-23 LAB — MYOCARDIAL PERFUSION IMAGING
CHL CUP MPHR: 165 {beats}/min
CHL CUP NUCLEAR SRS: 0
CHL CUP NUCLEAR SSS: 6
Estimated workload: 10.1 METS
Exercise duration (min): 9 min
Exercise duration (sec): 0 s
NUC STRESS TID: 0.9
Peak HR: 148 {beats}/min
Percent HR: 89 %
Rest HR: 71 {beats}/min
SDS: 6

## 2018-04-23 MED ORDER — TECHNETIUM TC 99M TETROFOSMIN IV KIT
31.5000 | PACK | Freq: Once | INTRAVENOUS | Status: AC | PRN
  Administered 2018-04-23: 31.5 via INTRAVENOUS
  Filled 2018-04-23: qty 32

## 2018-04-23 MED ORDER — TECHNETIUM TC 99M TETROFOSMIN IV KIT
10.4000 | PACK | Freq: Once | INTRAVENOUS | Status: AC | PRN
  Administered 2018-04-23: 10.4 via INTRAVENOUS
  Filled 2018-04-23: qty 11

## 2019-02-17 ENCOUNTER — Other Ambulatory Visit: Payer: Self-pay | Admitting: Interventional Cardiology

## 2019-04-26 NOTE — Progress Notes (Signed)
Cardiology Office Note:    Date:  04/30/2019   ID:  [name], DOB 16-Mar-1961, MRN [id number]  PCP:  System, Provider Not In  Cardiologist:  Sinclair Grooms, MD   Referring MD: No ref. provider found   Chief Complaint  Patient presents with  . Coronary Artery Disease  . Hypertension  . Hyperlipidemia    History of Present Illness:    [name] is a 58 y.o. female with a hx of CAD (posterior infarct treated with overlapping bare-metal stents in distal circumflex 2009), hypertension, and hyperlipidemia.  [name] is doing well.  She has not had cardiac complaints.  She denies orthopnea, PND, chest pain, lower extremity swelling, and syncope.  Her appetite is been stable.  She does not have primary care.  She has not had routine screening.  Past Medical History:  Diagnosis Date  . CAD in native artery 09/22/2013   Bare-metal stent, circumflex, 2009   . Essential hypertension 09/22/2013  . Hyperlipidemia 09/22/2013    History reviewed. No pertinent surgical history.  Current Medications: Current Meds  Medication Sig  . amLODipine (NORVASC) 10 MG tablet TAKE 1 TABLET BY MOUTH EVERY DAY  . aspirin 81 MG tablet Take 81 mg by mouth daily.  Marland Kitchen atorvastatin (LIPITOR) 40 MG tablet TAKE 1 TABLET BY MOUTH EVERY DAY  . lisinopril-hydrochlorothiazide (PRINZIDE,ZESTORETIC) 20-12.5 MG tablet TAKE 1 TABLET BY MOUTH  DAILY  . metoprolol succinate (TOPROL-XL) 50 MG 24 hr tablet TAKE 1 TABLET BY MOUTH  DAILY WITH OR IMMEDIATLEY  FOLLOWING A MEAL     Allergies:   Sulfa antibiotics   Social History   Socioeconomic History  . Marital status: Single    Spouse name: Not on file  . Number of children: Not on file  . Years of education: Not on file  . Highest education level: Not on file  Occupational History  . Not on file  Social Needs  . Financial resource strain: Not on file  . Food insecurity    Worry: Not on file    Inability: Not on file  . Transportation needs   Medical: Not on file    Non-medical: Not on file  Tobacco Use  . Smoking status: Former Smoker    Packs/day: 1.00    Quit date: 09/04/2007    Years since quitting: 11.6  . Smokeless tobacco: Never Used  Substance and Sexual Activity  . Alcohol use: Yes    Comment: occ  . Drug use: No  . Sexual activity: Not on file  Lifestyle  . Physical activity    Days per week: Not on file    Minutes per session: Not on file  . Stress: Not on file  Relationships  . Social Herbalist on phone: Not on file    Gets together: Not on file    Attends religious service: Not on file    Active member of club or organization: Not on file    Attends meetings of clubs or organizations: Not on file    Relationship status: Not on file  Other Topics Concern  . Not on file  Social History Narrative  . Not on file     Family History: The patient's family history includes Aneurysm in her mother; Cancer in her father; Hypertension in her brother, brother, and sister.  ROS:   Please see the history of present illness.    Her brother has an abdominal cancer.  He is younger than she is.  She has not smoked since her cardiac event.  All other systems reviewed and are negative.  EKGs/Labs/Other Studies Reviewed:    The following studies were reviewed today:  Nuclear stress test 04/23/2018: Study Highlights   This is a low risk study.  Patient walked on a standard Bruce protocol treadmill for 9 minutes. She achieved a peak heart rate of 165 which is 89% predicted maximal heart rate.  Blood pressure response to exercise was normal.  Were no ST or T wave changes to suggest ischemia.  The stress Myoview image was essentially normal.  The resting Myoview image was essentially normal. There was some mild attenuation toward the apical inferior wall.  There is no evidence of reversible ischemia on scintigraphic imaging.  Gating was not done properly so information regarding the LV size and  function are not available.  This is interpreted as a low risk Myoview study. There is no evidence of ischemia. Her left ventricle appears to be normal size. Formation about the LV function is not available due to inaccurate gating.  Suggest echocardiogram if needed to assess the left ventricular function.    EKG:  EKG sinus bradycardia, low voltage, nonspecific T wave abnormality.  When compared to the prior tracing from 2019, no changes occurred.   Recent Labs: No results found for requested labs within last 8760 hours.  Recent Lipid Panel    Component Value Date/Time   CHOL 162 04/09/2018 0849   TRIG 60 04/09/2018 0849   HDL 57 04/09/2018 0849   CHOLHDL 2.8 04/09/2018 0849   CHOLHDL 3.2 04/18/2016 0831   VLDL 14 04/18/2016 0831   LDLCALC 93 04/09/2018 0849    Physical Exam:    VS:  BP 122/86   Pulse (!) 59   Ht '5\' 2"'$  (1.575 m)   Wt 116 lb 9.6 oz (52.9 kg)   SpO2 99%   BMI 21.33 kg/m     Wt Readings from Last 3 Encounters:  04/30/19 116 lb 9.6 oz (52.9 kg)  03/12/18 121 lb 12.8 oz (55.2 kg)  03/05/17 117 lb 1.9 oz (53.1 kg)     GEN: Slender and healthy in appearance. No acute distress HEENT: Normal NECK: No JVD. LYMPHATICS: No lymphadenopathy CARDIAC:  RRR without murmur, gallop, or edema. VASCULAR:  Normal Pulses. No bruits. RESPIRATORY:  Clear to auscultation without rales, wheezing or rhonchi  ABDOMEN: Soft, non-tender, non-distended, No pulsatile mass, MUSCULOSKELETAL: No deformity  SKIN: Warm and dry NEUROLOGIC:  Alert and oriented x 3 PSYCHIATRIC:  Normal affect   ASSESSMENT:    1. CAD in native artery   2. Hyperlipidemia, unspecified hyperlipidemia type   3. Essential hypertension   4. Healthcare maintenance   5. Educated about COVID-19 virus infection    PLAN:    In order of problems listed above:  1. Secondary prevention discussed.  She is doing well in all areas. 2. Lipid panel will be obtained today 3. Pressure is excellent and she is  compliant with therapy 4. Does not have primary care.  Has not had mammogram or colonoscopy.  Her brother has colon cancer and he is younger than she is. 5. The 3W's is being practiced.  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  Overall education and awareness concerning primary/secondary risk prevention was discussed in detail: LDL less than 70, hemoglobin A1c less than 7, blood pressure target less than 130/80 mmHg, >150 minutes of moderate aerobic activity per week, avoidance of smoking, weight control (via diet and exercise),  and continued surveillance/management of/for obstructive sleep apnea.  Hemoglobin A1c, C met, CBC, and lipid panel will be obtained today.  Medication Adjustments/Labs and Tests Ordered: Current medicines are reviewed at length with the patient today.  Concerns regarding medicines are outlined above.  Orders Placed This Encounter  Procedures  . Basic metabolic panel  . Hepatic function panel  . HgB A1c  . Lipid panel  . CBC  . EKG 12-Lead   No orders of the defined types were placed in this encounter.   Patient Instructions  Medication Instructions:  Your physician recommends that you continue on your current medications as directed. Please refer to the Current Medication list given to you today.  *If you need a refill on your cardiac medications before your next appointment, please call your pharmacy*  Lab Work: BMET, Liver, Lipid, CBC, and A1C today  If you have labs (blood work) drawn today and your tests are completely normal, you will receive your results only by: Marland Kitchen MyChart Message (if you have MyChart) OR . A paper copy in the mail If you have any lab test that is abnormal or we need to change your treatment, we will call you to review the results.  Testing/Procedures: None  Follow-Up: At [facility], you and your health needs are our priority.  As part of our continuing mission to provide you with exceptional heart care, we have  created designated Provider Care Teams.  These Care Teams include your primary Cardiologist (physician) and Advanced Practice Providers (APPs -  Physician Assistants and Nurse Practitioners) who all work together to provide you with the care you need, when you need it.  Your next appointment:   12 month(s)  The format for your next appointment:   In Person  Provider:   You may see Sinclair Grooms, MD or one of the following Advanced Practice Providers on your designated Care Team:    Truitt Merle, NP  Cecilie Kicks, NP  Kathyrn Drown, NP   Other Instructions      Signed, Sinclair Grooms, MD  04/30/2019 9:46 AM    [place]

## 2019-04-30 ENCOUNTER — Ambulatory Visit (INDEPENDENT_AMBULATORY_CARE_PROVIDER_SITE_OTHER): Payer: BC Managed Care – PPO | Admitting: Interventional Cardiology

## 2019-04-30 ENCOUNTER — Encounter: Payer: Self-pay | Admitting: Interventional Cardiology

## 2019-04-30 ENCOUNTER — Other Ambulatory Visit: Payer: Self-pay

## 2019-04-30 VITALS — BP 122/86 | HR 59 | Ht 62.0 in | Wt 116.6 lb

## 2019-04-30 DIAGNOSIS — I10 Essential (primary) hypertension: Secondary | ICD-10-CM

## 2019-04-30 DIAGNOSIS — I2510 Atherosclerotic heart disease of native coronary artery without angina pectoris: Secondary | ICD-10-CM

## 2019-04-30 DIAGNOSIS — Z0000 Encounter for general adult medical examination without abnormal findings: Secondary | ICD-10-CM

## 2019-04-30 DIAGNOSIS — Z7189 Other specified counseling: Secondary | ICD-10-CM

## 2019-04-30 DIAGNOSIS — E785 Hyperlipidemia, unspecified: Secondary | ICD-10-CM

## 2019-04-30 NOTE — Patient Instructions (Signed)
Medication Instructions:  Your physician recommends that you continue on your current medications as directed. Please refer to the Current Medication list given to you today.  *If you need a refill on your cardiac medications before your next appointment, please call your pharmacy*  Lab Work: BMET, Liver, Lipid, CBC, and A1C today  If you have labs (blood work) drawn today and your tests are completely normal, you will receive your results only by: Marland Kitchen MyChart Message (if you have MyChart) OR . A paper copy in the mail If you have any lab test that is abnormal or we need to change your treatment, we will call you to review the results.  Testing/Procedures: None  Follow-Up: At [facility], you and your health needs are our priority.  As part of our continuing mission to provide you with exceptional heart care, we have created designated Provider Care Teams.  These Care Teams include your primary Cardiologist (physician) and Advanced Practice Providers (APPs -  Physician Assistants and Nurse Practitioners) who all work together to provide you with the care you need, when you need it.  Your next appointment:   12 month(s)  The format for your next appointment:   In Person  Provider:   You may see Sinclair Grooms, MD or one of the following Advanced Practice Providers on your designated Care Team:    Truitt Merle, NP  Cecilie Kicks, NP  Kathyrn Drown, NP   Other Instructions

## 2019-05-01 LAB — HEPATIC FUNCTION PANEL
ALT: 14 IU/L (ref 0–32)
AST: 16 IU/L (ref 0–40)
Albumin: 4.6 g/dL (ref 3.8–4.9)
Alkaline Phosphatase: 91 IU/L (ref 39–117)
Bilirubin Total: 0.5 mg/dL (ref 0.0–1.2)
Bilirubin, Direct: 0.11 mg/dL (ref 0.00–0.40)
Total Protein: 6.9 g/dL (ref 6.0–8.5)

## 2019-05-01 LAB — LIPID PANEL
Chol/HDL Ratio: 2.7 ratio (ref 0.0–4.4)
Cholesterol, Total: 164 mg/dL (ref 100–199)
HDL: 61 mg/dL (ref >39)
LDL Chol Calc (NIH): 91 mg/dL (ref 0–99)
Triglycerides: 60 mg/dL (ref 0–149)
VLDL Cholesterol Cal: 12 mg/dL (ref 5–40)

## 2019-05-01 LAB — HEMOGLOBIN A1C
Est. average glucose Bld gHb Est-mCnc: 131 mg/dL
Hgb A1c MFr Bld: 6.2 % — ABNORMAL HIGH (ref 4.8–5.6)

## 2019-05-01 LAB — CBC
Hematocrit: 37.5 % (ref 34.0–46.6)
Hemoglobin: 12.9 g/dL (ref 11.1–15.9)
MCH: 30.3 pg (ref 26.6–33.0)
MCHC: 34.4 g/dL (ref 31.5–35.7)
MCV: 88 fL (ref 79–97)
Platelets: 336 10*3/uL (ref 150–450)
RBC: 4.26 x10E6/uL (ref 3.77–5.28)
RDW: 12.8 % (ref 11.7–15.4)
WBC: 5.3 10*3/uL (ref 3.4–10.8)

## 2019-05-01 LAB — BASIC METABOLIC PANEL
BUN/Creatinine Ratio: 20 (ref 9–23)
BUN: 11 mg/dL (ref 6–24)
CO2: 24 mmol/L (ref 20–29)
Calcium: 9.7 mg/dL (ref 8.7–10.2)
Chloride: 103 mmol/L (ref 96–106)
Creatinine, Ser: 0.56 mg/dL — ABNORMAL LOW (ref 0.57–1.00)
GFR calc Af Amer: 121 mL/min/{1.73_m2} (ref >59)
GFR calc non Af Amer: 105 mL/min/{1.73_m2} (ref >59)
Glucose: 104 mg/dL — ABNORMAL HIGH (ref 65–99)
Potassium: 4.4 mmol/L (ref 3.5–5.2)
Sodium: 141 mmol/L (ref 134–144)

## 2019-05-02 ENCOUNTER — Other Ambulatory Visit: Payer: Self-pay | Admitting: Interventional Cardiology

## 2019-05-16 ENCOUNTER — Telehealth: Payer: Self-pay | Admitting: Interventional Cardiology

## 2019-05-16 DIAGNOSIS — E785 Hyperlipidemia, unspecified: Secondary | ICD-10-CM

## 2019-05-16 MED ORDER — ATORVASTATIN CALCIUM 80 MG PO TABS: 80.0000 mg | ORAL_TABLET | Freq: Every day | ORAL | 3 refills | Status: DC

## 2019-05-16 NOTE — Telephone Encounter (Signed)
Returned call to pt.  She has been made aware of her lab results. She will increase Atorvastatin to 80 mg qd and repeat lipid/lft 06/26/19.

## 2019-05-16 NOTE — Telephone Encounter (Signed)
Patient is returning phone call in regards to her results.

## 2019-06-04 ENCOUNTER — Other Ambulatory Visit: Payer: Self-pay | Admitting: Interventional Cardiology

## 2019-06-27 ENCOUNTER — Other Ambulatory Visit: Payer: BC Managed Care – PPO | Admitting: *Deleted

## 2019-06-27 ENCOUNTER — Other Ambulatory Visit: Payer: Self-pay

## 2019-06-27 DIAGNOSIS — E785 Hyperlipidemia, unspecified: Secondary | ICD-10-CM | POA: Diagnosis not present

## 2019-06-27 LAB — LIPID PANEL
Chol/HDL Ratio: 2.3 ratio (ref 0.0–4.4)
Cholesterol, Total: 147 mg/dL (ref 100–199)
HDL: 65 mg/dL (ref >39)
LDL Chol Calc (NIH): 72 mg/dL (ref 0–99)
Triglycerides: 42 mg/dL (ref 0–149)
VLDL Cholesterol Cal: 10 mg/dL (ref 5–40)

## 2019-06-27 LAB — HEPATIC FUNCTION PANEL
ALT: 13 IU/L (ref 0–32)
AST: 13 IU/L (ref 0–40)
Albumin: 4.6 g/dL (ref 3.8–4.9)
Alkaline Phosphatase: 81 IU/L (ref 39–117)
Bilirubin Total: 0.4 mg/dL (ref 0.0–1.2)
Bilirubin, Direct: 0.11 mg/dL (ref 0.00–0.40)
Total Protein: 6.6 g/dL (ref 6.0–8.5)

## 2020-05-16 ENCOUNTER — Other Ambulatory Visit: Payer: Self-pay | Admitting: Interventional Cardiology

## 2020-05-16 NOTE — Progress Notes (Signed)
Cardiology Office Note:    Date:  05/17/2020   ID:  [name], DOB 10-28-1960, MRN [id number]  PCP:  System, Provider Not In  Cardiologist:  Sinclair Grooms, MD   Referring MD: No ref. provider found   Chief Complaint  Patient presents with  . Coronary Artery Disease  . Hypertension  . Hyperlipidemia  . Prediabetes    History of Present Illness:    [name] is a 59 y.o. female with a hx of CAD (posterior infarct treated with overlapping bare-metal stents in distal circumflex 2009), hypertension, and hyperlipidemia.  She does not have primary care provider.  She has never had mammography or colonoscopy.  She has no cardiac complaints.  She denies angina.  She is compliant with her medication.  She does not smoke.   Past Medical History:  Diagnosis Date  . CAD in native artery 09/22/2013   Bare-metal stent, circumflex, 2009   . Essential hypertension 09/22/2013  . Hyperlipidemia 09/22/2013    History reviewed. No pertinent surgical history.  Current Medications: Current Meds  Medication Sig  . amLODipine (NORVASC) 10 MG tablet TAKE 1 TABLET BY MOUTH EVERY DAY  . aspirin 81 MG tablet Take 81 mg by mouth daily.  Marland Kitchen atorvastatin (LIPITOR) 80 MG tablet Take 1 tablet (80 mg total) by mouth daily.  Marland Kitchen lisinopril-hydrochlorothiazide (ZESTORETIC) 20-12.5 MG tablet TAKE 1 TABLET BY MOUTH  DAILY  . metoprolol succinate (TOPROL-XL) 50 MG 24 hr tablet TAKE 1 TABLET BY MOUTH  DAILY WITH OR IMMEDIATLEY  FOLLOWING A MEAL     Allergies:   Sulfa antibiotics   Social History   Socioeconomic History  . Marital status: Single    Spouse name: Not on file  . Number of children: Not on file  . Years of education: Not on file  . Highest education level: Not on file  Occupational History  . Not on file  Tobacco Use  . Smoking status: Former Smoker    Packs/day: 1.00    Quit date: 09/04/2007    Years since quitting: 12.7  . Smokeless tobacco: Never Used  Vaping Use  .  Vaping Use: Never used  Substance and Sexual Activity  . Alcohol use: Yes    Comment: occ  . Drug use: No  . Sexual activity: Not on file  Other Topics Concern  . Not on file  Social History Narrative  . Not on file   Social Determinants of Health   Financial Resource Strain: Not on file  Food Insecurity: Not on file  Transportation Needs: Not on file  Physical Activity: Not on file  Stress: Not on file  Social Connections: Not on file     Family History: The patient's family history includes Aneurysm in her mother; Cancer in her father; Hypertension in her brother, brother, and sister.  ROS:   Please see the history of present illness.    Work status [employer], paresthesia.  Walks 3 to 4 hours/day.  On her feet all day long.  All other systems reviewed and are negative.  EKGs/Labs/Other Studies Reviewed:    The following studies were reviewed today:  NUCLEAR STRESS TEST 2019: Study Highlights   This is a low risk study.  Patient walked on a standard Bruce protocol treadmill for 9 minutes. She achieved a peak heart rate of 165 which is 89% predicted maximal heart rate.  Blood pressure response to exercise was normal.  Were no ST or T wave changes to suggest ischemia.  The  stress Myoview image was essentially normal.  The resting Myoview image was essentially normal. There was some mild attenuation toward the apical inferior wall.  There is no evidence of reversible ischemia on scintigraphic imaging.  Gating was not done properly so information regarding the LV size and function are not available.  This is interpreted as a low risk Myoview study. There is no evidence of ischemia. Her left ventricle appears to be normal size. Formation about the LV function is not available due to inaccurate gating.  Suggest echocardiogram if needed to assess the left ventricular function.    EKG:  EKG normal sinus rhythm with nonspecific ST-T abnormality III, IV, aVF, and  V5/V6  Recent Labs: 06/27/2019: ALT 13  Recent Lipid Panel    Component Value Date/Time   CHOL 147 06/27/2019 1028   TRIG 42 06/27/2019 1028   HDL 65 06/27/2019 1028   CHOLHDL 2.3 06/27/2019 1028   CHOLHDL 3.2 04/18/2016 0831   VLDL 14 04/18/2016 0831   LDLCALC 72 06/27/2019 1028    Physical Exam:    VS:  BP 116/68   Pulse 63   Ht _0  (1.6 m)   Wt 121 lb (54.9 kg)   BMI 21.43 kg/m     Wt Readings from Last 3 Encounters:  05/17/20 121 lb (54.9 kg)  04/30/19 116 lb 9.6 oz (52.9 kg)  03/12/18 121 lb 12.8 oz (55.2 kg)     GEN: Healthy-appearing. No acute distress HEENT: Normal NECK: No JVD. LYMPHATICS: No lymphadenopathy CARDIAC:  RRR without murmur, gallop, or edema. VASCULAR:  Normal Pulses. No bruits. RESPIRATORY:  Clear to auscultation without rales, wheezing or rhonchi  ABDOMEN: Soft, non-tender, non-distended, No pulsatile mass, MUSCULOSKELETAL: No deformity  SKIN: Warm and dry NEUROLOGIC:  Alert and oriented x 3 PSYCHIATRIC:  Normal affect   ASSESSMENT:    1. CAD in native artery   2. Hyperlipidemia, unspecified hyperlipidemia type   3. Essential hypertension   4. Educated about COVID-19 virus infection   5. Prediabetes    PLAN:    In order of problems listed above:  1. Without angina.  Secondary prevention discussed. 2. LDL is at target on high intensity Lipitor 80 mg/day.  Lipid panel.  Hemoglobin A1c will also be obtained. 3. Blood pressure is excellent at 116/68.  Target 130/80 mmHg. 4. Vaccinated and practicing social distancing. 5. Lack of primary care and therefore no primary prevention/screening.  Mandatory that she establishes with primary care. Hemoglobin A1c, CBC, c-Met.  Overall education and awareness concerning secondary risk prevention was discussed in detail: LDL less than 70, hemoglobin A1c less than 7, blood pressure target less than 130/80 mmHg, >150 minutes of moderate aerobic activity per week, avoidance of smoking, weight control  (via diet and exercise), and continued surveillance/management of/for obstructive sleep apnea.    Medication Adjustments/Labs and Tests Ordered: Current medicines are reviewed at length with the patient today.  Concerns regarding medicines are outlined above.  Orders Placed This Encounter  Procedures  . EKG 12-Lead   No orders of the defined types were placed in this encounter.   There are no Patient Instructions on file for this visit.   Signed, Sinclair Grooms, MD  05/17/2020 10:05 AM    [place]

## 2020-05-17 ENCOUNTER — Encounter: Payer: Self-pay | Admitting: Interventional Cardiology

## 2020-05-17 ENCOUNTER — Other Ambulatory Visit: Payer: Self-pay

## 2020-05-17 ENCOUNTER — Ambulatory Visit (INDEPENDENT_AMBULATORY_CARE_PROVIDER_SITE_OTHER): Payer: BC Managed Care – PPO | Admitting: Interventional Cardiology

## 2020-05-17 VITALS — BP 116/68 | HR 63 | Ht 63.0 in | Wt 121.0 lb

## 2020-05-17 DIAGNOSIS — Z7189 Other specified counseling: Secondary | ICD-10-CM

## 2020-05-17 DIAGNOSIS — I10 Essential (primary) hypertension: Secondary | ICD-10-CM | POA: Diagnosis not present

## 2020-05-17 DIAGNOSIS — E785 Hyperlipidemia, unspecified: Secondary | ICD-10-CM | POA: Diagnosis not present

## 2020-05-17 DIAGNOSIS — R7303 Prediabetes: Secondary | ICD-10-CM

## 2020-05-17 DIAGNOSIS — I2510 Atherosclerotic heart disease of native coronary artery without angina pectoris: Secondary | ICD-10-CM

## 2020-05-17 NOTE — Patient Instructions (Signed)
Medication Instructions:  Your physician recommends that you continue on your current medications as directed. Please refer to the Current Medication list given to you today.  *If you need a refill on your cardiac medications before your next appointment, please call your pharmacy*   Lab Work: BMET, Liver, Lipid, A1C and CBC today  If you have labs (blood work) drawn today and your tests are completely normal, you will receive your results only by:  MyChart Message (if you have MyChart) OR  A paper copy in the mail If you have any lab test that is abnormal or we need to change your treatment, we will call you to review the results.   Testing/Procedures: None   Follow-Up: At [facility], you and your health needs are our priority.  As part of our continuing mission to provide you with exceptional heart care, we have created designated Provider Care Teams.  These Care Teams include your primary Cardiologist (physician) and Advanced Practice Providers (APPs -  Physician Assistants and Nurse Practitioners) who all work together to provide you with the care you need, when you need it.  We recommend signing up for the patient portal called "MyChart".  Sign up information is provided on this After Visit Summary.  MyChart is used to connect with patients for Virtual Visits (Telemedicine).  Patients are able to view lab/test results, encounter notes, upcoming appointments, etc.  Non-urgent messages can be sent to your provider as well.   To learn more about what you can do with MyChart, go to [email or web address].    Your next appointment:   1 year(s)  The format for your next appointment:   In Person  Provider:   You may see Lesleigh Noe, MD or one of the following Advanced Practice Providers on your designated Care Team:    Norma Fredrickson, NP  Nada Boozer, NP  Georgie Chard, NP    Other Instructions  Dr. Katrinka Blazing recommends that you establish with a Primary Care  Physician as soon as possible.  Call [phone] and let them know you are seeing a Primary Care.  They will be able to let you know who is close by to you and taking new patients.

## 2020-05-18 LAB — HEPATIC FUNCTION PANEL
ALT: 20 IU/L (ref 0–32)
AST: 15 IU/L (ref 0–40)
Albumin: 4.4 g/dL (ref 3.8–4.9)
Alkaline Phosphatase: 98 IU/L (ref 44–121)
Bilirubin Total: 0.4 mg/dL (ref 0.0–1.2)
Bilirubin, Direct: 0.13 mg/dL (ref 0.00–0.40)
Total Protein: 6.9 g/dL (ref 6.0–8.5)

## 2020-05-18 LAB — CBC
Hematocrit: 38.2 % (ref 34.0–46.6)
Hemoglobin: 13.1 g/dL (ref 11.1–15.9)
MCH: 30.2 pg (ref 26.6–33.0)
MCHC: 34.3 g/dL (ref 31.5–35.7)
MCV: 88 fL (ref 79–97)
Platelets: 340 10*3/uL (ref 150–450)
RBC: 4.34 x10E6/uL (ref 3.77–5.28)
RDW: 11.8 % (ref 11.7–15.4)
WBC: 5.3 10*3/uL (ref 3.4–10.8)

## 2020-05-18 LAB — LIPID PANEL
Chol/HDL Ratio: 2.4 ratio (ref 0.0–4.4)
Cholesterol, Total: 155 mg/dL (ref 100–199)
HDL: 64 mg/dL (ref >39)
LDL Chol Calc (NIH): 77 mg/dL (ref 0–99)
Triglycerides: 73 mg/dL (ref 0–149)
VLDL Cholesterol Cal: 14 mg/dL (ref 5–40)

## 2020-05-18 LAB — HEMOGLOBIN A1C
Est. average glucose Bld gHb Est-mCnc: 137 mg/dL
Hgb A1c MFr Bld: 6.4 % — ABNORMAL HIGH (ref 4.8–5.6)

## 2020-05-18 LAB — BASIC METABOLIC PANEL
BUN/Creatinine Ratio: 20 (ref 9–23)
BUN: 11 mg/dL (ref 6–24)
CO2: 24 mmol/L (ref 20–29)
Calcium: 10 mg/dL (ref 8.7–10.2)
Chloride: 104 mmol/L (ref 96–106)
Creatinine, Ser: 0.56 mg/dL — ABNORMAL LOW (ref 0.57–1.00)
GFR calc Af Amer: 120 mL/min/{1.73_m2} (ref >59)
GFR calc non Af Amer: 104 mL/min/{1.73_m2} (ref >59)
Glucose: 106 mg/dL — ABNORMAL HIGH (ref 65–99)
Potassium: 4.1 mmol/L (ref 3.5–5.2)
Sodium: 142 mmol/L (ref 134–144)

## 2020-05-26 ENCOUNTER — Other Ambulatory Visit: Payer: Self-pay | Admitting: Interventional Cardiology

## 2021-01-12 DIAGNOSIS — Z20822 Contact with and (suspected) exposure to covid-19: Secondary | ICD-10-CM | POA: Diagnosis not present

## 2021-01-26 ENCOUNTER — Ambulatory Visit (HOSPITAL_COMMUNITY)
Admission: EM | Admit: 2021-01-26 | Discharge: 2021-01-26 | Disposition: A | Discharge status: Home / Self Care | Payer: BC Managed Care – PPO | Attending: Student | Admitting: Student

## 2021-01-26 ENCOUNTER — Other Ambulatory Visit: Payer: Self-pay

## 2021-01-26 ENCOUNTER — Encounter (HOSPITAL_COMMUNITY): Payer: Self-pay

## 2021-01-26 DIAGNOSIS — M7751 Other enthesopathy of right foot: Secondary | ICD-10-CM | POA: Diagnosis not present

## 2021-01-26 MED ORDER — PREDNISONE 20 MG PO TABS: 40.0000 mg | ORAL_TABLET | Freq: Every day | ORAL | 0 refills | Status: AC

## 2021-01-26 NOTE — ED Triage Notes (Signed)
Pt presents with swelling and pain in the right ankle x 1 day.

## 2021-01-26 NOTE — ED Provider Notes (Signed)
[facility]    CSN: [id number] Arrival date & time: 01/26/21  1017      History   Chief Complaint Chief Complaint  Patient presents with   Ankle Pain    HPI [name] is a 60 y.o. female presenting with right ankle pain for about 1 month.  Medical history noncontributory.  Pain is worse to lateral aspect, but some pain medially as well. Denies trauma but does endorse overuse due to active job. She is not a diabetic.  HPI  Past Medical History:  Diagnosis Date   CAD in native artery 09/22/2013   Bare-metal stent, circumflex, 2009    Essential hypertension 09/22/2013   Hyperlipidemia 09/22/2013    Patient Active Problem List   Diagnosis Date Noted   CAD in native artery 09/22/2013   Hyperlipidemia 09/22/2013   Essential hypertension 09/22/2013    History reviewed. No pertinent surgical history.  OB History   No obstetric history on file.      Home Medications    Prior to Admission medications   Medication Sig Start Date End Date Taking? Authorizing Provider  predniSONE (DELTASONE) 20 MG tablet Take 2 tablets (40 mg total) by mouth daily for 5 days. Take with breakfast or lunch. Avoid NSAIDs (ibuprofen, etc) while taking this medication. 01/26/21 01/31/21 Yes Rhys Martini, PA-C  amLODipine (NORVASC) 10 MG tablet TAKE 1 TABLET BY MOUTH EVERY DAY 05/18/20   Lyn Records, MD  aspirin 81 MG tablet Take 81 mg by mouth daily.    [provider]  atorvastatin (LIPITOR) 80 MG tablet TAKE 1 TABLET BY MOUTH EVERY DAY 05/18/20   Lyn Records, MD  lisinopril-hydrochlorothiazide (ZESTORETIC) 20-12.5 MG tablet TAKE 1 TABLET BY MOUTH  DAILY 05/26/20   Lyn Records, MD  metoprolol succinate (TOPROL-XL) 50 MG 24 hr tablet TAKE 1 TABLET BY MOUTH  DAILY WITH OR IMMEDIATELY  FOLLOWING A MEAL 05/26/20   Lyn Records, MD    Family History Family History  Problem Relation Age of Onset   Cancer Father        LIVER   Aneurysm Mother    Hypertension  Sister    Hypertension Brother    Hypertension Brother     Social History Social History   Tobacco Use   Smoking status: Former    Packs/day: 1.00    Types: Cigarettes    Quit date: 09/04/2007    Years since quitting: 13.4   Smokeless tobacco: Never  Vaping Use   Vaping Use: Never used  Substance Use Topics   Alcohol use: Yes    Comment: occ   Drug use: No     Allergies   Sulfa antibiotics   Review of Systems Review of Systems  Musculoskeletal:        R ankle pain  All other systems reviewed and are negative.   Physical Exam Triage Vital Signs ED Triage Vitals  Enc Vitals Group     BP 01/26/21 0916 137/70     Pulse Rate 01/26/21 0916 62     Resp 01/26/21 0916 18     Temp 01/26/21 0916 99 F (37.2 C)     Temp Source 01/26/21 0916 Oral     SpO2 01/26/21 0916 100 %     Weight --      Height --      Head Circumference --      Peak Flow --      Pain Score 01/26/21 0915 7  Pain Loc --      Pain Edu? --      Excl. in GC? --    No data found.  Updated Vital Signs BP 137/70 (BP Location: Left Arm)   Pulse 62   Temp 99 F (37.2 C) (Oral)   Resp 18   SpO2 100%   Visual Acuity Right Eye Distance:   Left Eye Distance:   Bilateral Distance:    Right Eye Near:   Left Eye Near:    Bilateral Near:     Physical Exam Vitals reviewed.  Constitutional:      General: She is not in acute distress.    Appearance: Normal appearance. She is not ill-appearing or diaphoretic.  HENT:     Head: Normocephalic and atraumatic.  Cardiovascular:     Rate and Rhythm: Normal rate and regular rhythm.     Heart sounds: Normal heart sounds.  Pulmonary:     Effort: Pulmonary effort is normal.     Breath sounds: Normal breath sounds.  Musculoskeletal:     Comments: R ankle- no tenderness to palpation. Mild effusion to lateral aspect. No malleolar tenderness. Pain along ATF ligament elicited with plantarflexion. DP 2+, cap refill <2 seconds. No midfoot tenderness. No  achilles tenderness.   Skin:    General: Skin is warm.  Neurological:     General: No focal deficit present.     Mental Status: She is alert and oriented to person, place, and time.  Psychiatric:        Mood and Affect: Mood normal.        Behavior: Behavior normal.        Thought Content: Thought content normal.        Judgment: Judgment normal.     UC Treatments / Results  Labs (all labs ordered are listed, but only abnormal results are displayed) Labs Reviewed - No data to display  EKG   Radiology No results found.  Procedures Procedures (including critical care time)  Medications Ordered in UC Medications - No data to display  Initial Impression / Assessment and Plan / UC Course  I have reviewed the triage vital signs and the nursing notes.  Pertinent labs & imaging results that were available during my care of the patient were reviewed by me and considered in my medical decision making (see chart for details).     This patient is a very pleasant 60 y.o. year old female presenting with tendonitis R ATF ligament related to overuse. Ace wrap, prednisone, RICE. F/u with ortho in 3-4 days of symptoms persist. ED return precautions discussed. Patient verbalizes understanding and agreement.    Final Clinical Impressions(s) / UC Diagnoses   Final diagnoses:  Right ankle tendonitis     Discharge Instructions      -Prednisone, 2 pills taken at the same time for 5 days in a row.  Try taking this earlier in the day as it can give you energy. Avoid NSAIDs like ibuprofen and alleve while taking this medication as they can increase your risk of stomach upset and even GI bleeding when in combination with a steroid. You can continue tylenol (acetaminophen) up to 1000mg  3x daily. -Rest, ice, tylenol, compression sock, elevate your foot at the end of the day -If symptoms persist in 3 days, follow-up with an orthopedist. I recommend [facility] at [address],  [place], [place]. You can schedule an appointment by calling [phone]) or online (([phone]), but they also have a walk-in clinic M-F  8a-8p and Sat 10a-3p.      ED Prescriptions     Medication Sig Dispense Auth. Provider   predniSONE (DELTASONE) 20 MG tablet Take 2 tablets (40 mg total) by mouth daily for 5 days. Take with breakfast or lunch. Avoid NSAIDs (ibuprofen, etc) while taking this medication. 10 tablet Rhys Martini, PA-C      PDMP not reviewed this encounter.   Rhys Martini, PA-C 01/26/21 1011

## 2021-01-26 NOTE — Discharge Instructions (Addendum)
-  Prednisone, 2 pills taken at the same time for 5 days in a row.  Try taking this earlier in the day as it can give you energy. Avoid NSAIDs like ibuprofen and alleve while taking this medication as they can increase your risk of stomach upset and even GI bleeding when in combination with a steroid. You can continue tylenol (acetaminophen) up to 1000mg  3x daily. -Rest, ice, tylenol, compression sock, elevate your foot at the end of the day -If symptoms persist in 3 days, follow-up with an orthopedist. I recommend [facility] at [address] [place], [place]. You can schedule an appointment by calling [phone]) or online (([phone]), but they also have a walk-in clinic M-F 8a-8p and Sat 10a-3p.

## 2021-02-01 DIAGNOSIS — M25571 Pain in right ankle and joints of right foot: Secondary | ICD-10-CM | POA: Diagnosis not present

## 2021-05-16 ENCOUNTER — Other Ambulatory Visit: Payer: Self-pay | Admitting: Interventional Cardiology

## 2021-05-19 ENCOUNTER — Other Ambulatory Visit: Payer: Self-pay | Admitting: Interventional Cardiology

## 2021-08-22 ENCOUNTER — Other Ambulatory Visit: Payer: Self-pay | Admitting: Interventional Cardiology

## 2021-08-22 ENCOUNTER — Ambulatory Visit: Payer: BC Managed Care – PPO | Admitting: Interventional Cardiology

## 2021-09-06 ENCOUNTER — Ambulatory Visit (INDEPENDENT_AMBULATORY_CARE_PROVIDER_SITE_OTHER): Payer: BC Managed Care – PPO | Admitting: Interventional Cardiology

## 2021-09-06 ENCOUNTER — Encounter: Payer: Self-pay | Admitting: Interventional Cardiology

## 2021-09-06 VITALS — BP 130/80 | HR 65 | Ht 63.0 in | Wt 118.2 lb

## 2021-09-06 DIAGNOSIS — I10 Essential (primary) hypertension: Secondary | ICD-10-CM | POA: Diagnosis not present

## 2021-09-06 DIAGNOSIS — E785 Hyperlipidemia, unspecified: Secondary | ICD-10-CM

## 2021-09-06 DIAGNOSIS — I2510 Atherosclerotic heart disease of native coronary artery without angina pectoris: Secondary | ICD-10-CM

## 2021-09-06 DIAGNOSIS — R7303 Prediabetes: Secondary | ICD-10-CM

## 2021-09-06 NOTE — Patient Instructions (Signed)
Medication Instructions:  ?Your physician recommends that you continue on your current medications as directed. Please refer to the Current Medication list given to you today. ? ?*If you need a refill on your cardiac medications before your next appointment, please call your pharmacy* ? ? ?Lab Work: ?BMET, Lipid, Liver and A1C today ? ?If you have labs (blood work) drawn today and your tests are completely normal, you will receive your results only by: ?MyChart Message (if you have MyChart) OR ?A paper copy in the mail ?If you have any lab test that is abnormal or we need to change your treatment, we will call you to review the results. ? ? ?Testing/Procedures: ?None ? ? ?Follow-Up: ?At [facility], you and your health needs are our priority.  As part of our continuing mission to provide you with exceptional heart care, we have created designated Provider Care Teams.  These Care Teams include your primary Cardiologist (physician) and Advanced Practice Providers (APPs -  Physician Assistants and Nurse Practitioners) who all work together to provide you with the care you need, when you need it. ? ?We recommend signing up for the patient portal called "MyChart".  Sign up information is provided on this After Visit Summary.  MyChart is used to connect with patients for Virtual Visits (Telemedicine).  Patients are able to view lab/test results, encounter notes, upcoming appointments, etc.  Non-urgent messages can be sent to your provider as well.   ?To learn more about what you can do with MyChart, go to [email or web address].   ? ?Your next appointment:   ?1 year(s) ? ?The format for your next appointment:   ?In Person ? ?Provider:   ?Sinclair Grooms, MD  ? ? ?Other Instructions ?  ?

## 2021-09-06 NOTE — Progress Notes (Signed)
?Cardiology Office Note:   ? ?Date:  09/06/2021  ? ?ID:  [name], DOB 05/14/1961, MRN [id number] ? ?PCP:  System, Provider Not In  ?Cardiologist:  Lesleigh Noe, MD  ? ?Referring MD: No ref. provider found  ? ?No chief complaint on file. ? ? ?History of Present Illness:   ? ?[name] is a 61 y.o. female with a hx of prediabetes, CAD (posterior infarct treated with overlapping bare-metal stents in distal circumflex 2009), hypertension, and hyperlipidemia. ? ? ? ?Doing well.  No symptoms.  Discontinued smoking in 2009.  No exertional angina.  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  She did know that her hemoglobin A1c was elevated last year and has cut back on potatoes which was her primary source of starch. ? ? ? ?Past Medical History:  ?Diagnosis Date  ? CAD in native artery 09/22/2013  ? Bare-metal stent, circumflex, 2009   ? Essential hypertension 09/22/2013  ? Hyperlipidemia 09/22/2013  ? ? ?History reviewed. No pertinent surgical history. ? ?Current Medications: ?Current Meds  ?Medication Sig  ? amLODipine (NORVASC) 10 MG tablet TAKE 1 TABLET (10 MG TOTAL) BY MOUTH DAILY. PLEASE KEEP UPCOMING APPT. WITH DR. Katrinka Blazing IN ORDER TO RECEIVE FURTHER REFILLS. THANK YOU.  ? aspirin 81 MG tablet Take 81 mg by mouth daily.  ? atorvastatin (LIPITOR) 80 MG tablet Take 1 tablet (80 mg total) by mouth daily. Please keep upcoming appt.with Dr. Katrinka Blazing in June 2023 before anymore refills. Thank you. Final Attempt  ? lisinopril-hydrochlorothiazide (ZESTORETIC) 20-12.5 MG tablet TAKE 1 TABLET BY MOUTH  DAILY  ? metoprolol succinate (TOPROL-XL) 50 MG 24 hr tablet TAKE 1 TABLET BY MOUTH  DAILY WITH OR IMMEDIATELY  FOLLOWING A MEAL  ?  ? ?Allergies:   Sulfa antibiotics  ? ?Social History  ? ?Socioeconomic History  ? Marital status: Single  ?  Spouse name: Not on file  ? Number of children: Not on file  ? Years of education: Not on file  ? Highest education level: Not on file  ?Occupational History  ?  Not on file  ?Tobacco Use  ? Smoking status: Former  ?  Packs/day: 1.00  ?  Types: Cigarettes  ?  Quit date: 09/04/2007  ?  Years since quitting: 14.0  ? Smokeless tobacco: Never  ?Vaping Use  ? Vaping Use: Never used  ?Substance and Sexual Activity  ? Alcohol use: Yes  ?  Comment: occ  ? Drug use: No  ? Sexual activity: Not on file  ?Other Topics Concern  ? Not on file  ?Social History Narrative  ? Not on file  ? ?Social Determinants of Health  ? ?Financial Resource Strain: Not on file  ?Food Insecurity: Not on file  ?Transportation Needs: Not on file  ?Physical Activity: Not on file  ?Stress: Not on file  ?Social Connections: Not on file  ?  ? ?Family History: ?The patient's family history includes Aneurysm in her mother; Cancer in her father; Hypertension in her brother, brother, and sister. ? ?ROS:   ?Please see the history of present illness.    ?No real complaints.  Sleeps well.  Does not snore that she is aware of.  All other systems reviewed and are negative. ? ?EKGs/Labs/Other Studies Reviewed:   ? ?The following studies were reviewed today: ?No cardiac imaging ? ?EKG:  EKG normal sinus rhythm, nonspecific T wave flattening inferior and precordial leads.  Otherwise normal.  Personally reviewed.  When compared  to May 17, 2020, T wave abnormality is not as severe. ? ?Recent Labs: ?No results found for requested labs within last 8760 hours.  ?Recent Lipid Panel ?   ?Component Value Date/Time  ? CHOL 155 05/17/2020 1012  ? TRIG 73 05/17/2020 1012  ? HDL 64 05/17/2020 1012  ? CHOLHDL 2.4 05/17/2020 1012  ? CHOLHDL 3.2 04/18/2016 0831  ? VLDL 14 04/18/2016 0831  ? LDLCALC 77 05/17/2020 1012  ? ? ?Physical Exam:   ? ?VS:  BP 130/80   Pulse 65   Ht 5\' 3"  (1.6 m)   Wt 118 lb 3.2 oz (53.6 kg)   SpO2 98%   BMI 20.94 kg/m?    ? ?Wt Readings from Last 3 Encounters:  ?09/06/21 118 lb 3.2 oz (53.6 kg)  ?05/17/20 121 lb (54.9 kg)  ?04/30/19 116 lb 9.6 oz (52.9 kg)  ?  ? ?GEN: Slender. No acute distress ?HEENT:  Normal ?NECK: No JVD. ?LYMPHATICS: No lymphadenopathy ?CARDIAC: No murmur. RRR no gallop, or edema. ?VASCULAR:  Normal Pulses. No bruits. ?RESPIRATORY:  Clear to auscultation without rales, wheezing or rhonchi  ?ABDOMEN: Soft, non-tender, non-distended, No pulsatile mass, ?MUSCULOSKELETAL: No deformity  ?SKIN: Warm and dry ?NEUROLOGIC:  Alert and oriented x 3 ?PSYCHIATRIC:  Normal affect  ? ?ASSESSMENT:   ? ?1. CAD in native artery   ?2. Hyperlipidemia, unspecified hyperlipidemia type   ?3. Essential hypertension   ?4. Prediabetes   ? ?PLAN:   ? ?In order of problems listed above: ? ?Doing well.  Reviewed secondary prevention. ?Continue high intensity statin therapy.  Last LDL was 77.  May need to add Zetia.  Lipid panel today. ?Perfect blood pressure control on for age and regimen. ?Check hemoglobin A1c.  When checked last year was 6.4.  May need to consider doing an echocardiogram to look for for diastolic dysfunction. ? ?Overall education and awareness concerning secondary risk prevention was discussed in detail: LDL less than 70, hemoglobin A1c less than 7, blood pressure target less than 130/80 mmHg, >150 minutes of moderate aerobic activity per week, avoidance of smoking, weight control (via diet and exercise), and continued surveillance/management of/for obstructive sleep apnea. ? ? ? ?Medication Adjustments/Labs and Tests Ordered: ?Current medicines are reviewed at length with the patient today.  Concerns regarding medicines are outlined above.  ?Orders Placed This Encounter  ?Procedures  ? Lipid panel  ? Hepatic function panel  ? Basic metabolic panel  ? HgB A1c  ? EKG 12-Lead  ? ?No orders of the defined types were placed in this encounter. ? ? ?Patient Instructions  ?Medication Instructions:  ?Your physician recommends that you continue on your current medications as directed. Please refer to the Current Medication list given to you today. ? ?*If you need a refill on your cardiac medications before your  next appointment, please call your pharmacy* ? ? ?Lab Work: ?BMET, Lipid, Liver and A1C today ? ?If you have labs (blood work) drawn today and your tests are completely normal, you will receive your results only by: ?MyChart Message (if you have MyChart) OR ?A paper copy in the mail ?If you have any lab test that is abnormal or we need to change your treatment, we will call you to review the results. ? ? ?Testing/Procedures: ?None ? ? ?Follow-Up: ?At [facility], you and your health needs are our priority.  As part of our continuing mission to provide you with exceptional heart care, we have created designated Provider Care Teams.  These Care Teams  include your primary Cardiologist (physician) and Advanced Practice Providers (APPs -  Physician Assistants and Nurse Practitioners) who all work together to provide you with the care you need, when you need it. ? ?We recommend signing up for the patient portal called "MyChart".  Sign up information is provided on this After Visit Summary.  MyChart is used to connect with patients for Virtual Visits (Telemedicine).  Patients are able to view lab/test results, encounter notes, upcoming appointments, etc.  Non-urgent messages can be sent to your provider as well.   ?To learn more about what you can do with MyChart, go to [email or web address].   ? ?Your next appointment:   ?1 year(s) ? ?The format for your next appointment:   ?In Person ? ?Provider:   ?Lesleigh NoeHenry W Vyron Fronczak III, MD  ? ? ?Other Instructions ?   ? ?Signed, ?Lesleigh NoeHenry W Missi Mcmackin III, MD  ?09/06/2021 11:12 AM    ?[facility] ?

## 2021-09-07 ENCOUNTER — Other Ambulatory Visit: Payer: Self-pay | Admitting: Interventional Cardiology

## 2021-09-07 LAB — HEPATIC FUNCTION PANEL
ALT: 16 IU/L (ref 0–32)
AST: 13 IU/L (ref 0–40)
Albumin: 4.7 g/dL (ref 3.8–4.9)
Alkaline Phosphatase: 95 IU/L (ref 44–121)
Bilirubin Total: 0.5 mg/dL (ref 0.0–1.2)
Bilirubin, Direct: 0.13 mg/dL (ref 0.00–0.40)
Total Protein: 6.9 g/dL (ref 6.0–8.5)

## 2021-09-07 LAB — BASIC METABOLIC PANEL
BUN/Creatinine Ratio: 22 (ref 9–23)
BUN: 11 mg/dL (ref 6–24)
CO2: 24 mmol/L (ref 20–29)
Calcium: 9.8 mg/dL (ref 8.7–10.2)
Chloride: 103 mmol/L (ref 96–106)
Creatinine, Ser: 0.51 mg/dL — ABNORMAL LOW (ref 0.57–1.00)
Glucose: 98 mg/dL (ref 70–99)
Potassium: 3.9 mmol/L (ref 3.5–5.2)
Sodium: 141 mmol/L (ref 134–144)
eGFR: 108 mL/min/{1.73_m2} (ref >59)

## 2021-09-07 LAB — LIPID PANEL
Chol/HDL Ratio: 3 ratio (ref 0.0–4.4)
Cholesterol, Total: 154 mg/dL (ref 100–199)
HDL: 52 mg/dL (ref >39)
LDL Chol Calc (NIH): 91 mg/dL (ref 0–99)
Triglycerides: 54 mg/dL (ref 0–149)
VLDL Cholesterol Cal: 11 mg/dL (ref 5–40)

## 2021-09-07 LAB — HEMOGLOBIN A1C
Est. average glucose Bld gHb Est-mCnc: 131 mg/dL
Hgb A1c MFr Bld: 6.2 % — ABNORMAL HIGH (ref 4.8–5.6)

## 2021-11-21 ENCOUNTER — Other Ambulatory Visit: Payer: Self-pay | Admitting: Interventional Cardiology

## 2021-11-29 ENCOUNTER — Ambulatory Visit: Payer: BC Managed Care – PPO | Admitting: Interventional Cardiology

## 2022-08-21 ENCOUNTER — Other Ambulatory Visit: Payer: Self-pay

## 2022-08-21 MED ORDER — LISINOPRIL-HYDROCHLOROTHIAZIDE 20-12.5 MG PO TABS: 1.0000 | ORAL_TABLET | Freq: Every day | ORAL | 0 refills | Status: DC

## 2022-08-24 ENCOUNTER — Other Ambulatory Visit: Payer: Self-pay | Admitting: *Deleted

## 2022-08-24 MED ORDER — METOPROLOL SUCCINATE ER 50 MG PO TB24: ORAL_TABLET | ORAL | 3 refills | Status: DC

## 2022-08-29 NOTE — Progress Notes (Unsigned)
Cardiology Office Note:    Date:  09/05/2022   ID:  [name], DOB [birth year], MRN [id number]  PCP:  System, Provider Not In   [place] Providers Cardiologist:  Freada Bergeron, MD {  Referring MD: No ref. provider found     History of Present Illness:    [name] is a 62 y.o. female with a hx of CAD s/p BMS to Lcx in 2009, HTN, and HLD who was previously followed by Dr. Tamala Julian who now presents to clinic for follow-up.  Was last seen in clinic on 09/2021 where she was doing well from a CV standpoint.  Today, the patient overall feels well. No chest pain, SOB, LE edema, palpitations, orthopnea or PND. Remains active with exertional symptoms. Tolerating medications as prescribed.   Past Medical History:  Diagnosis Date   CAD in native artery 09/22/2013   Bare-metal stent, circumflex, 2009    Essential hypertension 09/22/2013   Hyperlipidemia 09/22/2013    No past surgical history on file.  Current Medications: Current Meds  Medication Sig   aspirin 81 MG tablet Take 81 mg by mouth daily.   [DISCONTINUED] amLODipine (NORVASC) 10 MG tablet TAKE 1 TABLET BY MOUTH DAILY. PLEASE KEEP UPCOMING APPT. IN ORDER TO RECEIVE FURTHER REFILLS.   [DISCONTINUED] atorvastatin (LIPITOR) 80 MG tablet TAKE 1 TABLET (80 MG TOTAL) BY MOUTH DAILY. PLEASE KEEP UPCOMING APPT.WITH DR. Tamala Julian IN JUNE 2023 BEFORE ANYMORE REFILLS. THANK YOU. FINAL ATTEMPT   [DISCONTINUED] lisinopril-hydrochlorothiazide (ZESTORETIC) 20-12.5 MG tablet Take 1 tablet by mouth daily.   [DISCONTINUED] metoprolol succinate (TOPROL-XL) 50 MG 24 hr tablet TAKE 1 TABLET BY MOUTH DAILY  WITH OR IMMEDIATELY FOLLOWING A  MEAL     Allergies:   Sulfa antibiotics   Social History   Socioeconomic History   Marital status: Single    Spouse name: Not on file   Number of children: Not on file   Years of education: Not on file   Highest education level: Not on file  Occupational History   Not on file   Tobacco Use   Smoking status: Former    Packs/day: 1    Types: Cigarettes    Quit date: 09/04/2007    Years since quitting: 15.0   Smokeless tobacco: Never  Vaping Use   Vaping Use: Never used  Substance and Sexual Activity   Alcohol use: Yes    Comment: occ   Drug use: No   Sexual activity: Not on file  Other Topics Concern   Not on file  Social History Narrative   Not on file   Social Determinants of Health   Financial Resource Strain: Not on file  Food Insecurity: Not on file  Transportation Needs: Not on file  Physical Activity: Not on file  Stress: Not on file  Social Connections: Not on file     Family History: The patient's family history includes Aneurysm in her mother; Cancer in her father; Hypertension in her brother, brother, and sister.  ROS:   Please see the history of present illness.     All other systems reviewed and are negative.  EKGs/Labs/Other Studies Reviewed:    The following studies were reviewed today: Myoview 2019: This is a low risk study. Patient walked on a standard Bruce protocol treadmill for 9 minutes. She achieved a peak heart rate of 165 which is 89% predicted maximal heart rate. Blood pressure response to exercise was normal. Were no ST or T wave changes to suggest ischemia. The  stress Myoview image was essentially normal. The resting Myoview image was essentially normal. There was some mild attenuation toward the apical inferior wall. There is no evidence of reversible ischemia on scintigraphic imaging. Gating was not done properly so information regarding the LV size and function are not available. This is interpreted as a low risk Myoview study. There is no evidence of ischemia. Her left ventricle appears to be normal size. Formation about the LV function is not available due to inaccurate gating. Suggest echocardiogram if needed to assess the left ventricular function.  EKG:  EKG is  ordered today.  The ekg ordered today  demonstrates NSR with HR 60  Recent Labs: 09/06/2021: ALT 16; BUN 11; Creatinine, Ser 0.51; Potassium 3.9; Sodium 141  Recent Lipid Panel    Component Value Date/Time   CHOL 154 09/06/2021 1114   TRIG 54 09/06/2021 1114   HDL 52 09/06/2021 1114   CHOLHDL 3.0 09/06/2021 1114   CHOLHDL 3.2 04/18/2016 0831   VLDL 14 04/18/2016 0831   LDLCALC 91 09/06/2021 1114     Risk Assessment/Calculations:            Physical Exam:    VS:  BP (!) 136/90   Pulse 88   Ht 5\' 3"  (1.6 m)   Wt 118 lb 12.8 oz (53.9 kg)   SpO2 96%   BMI 21.04 kg/m     Wt Readings from Last 3 Encounters:  09/05/22 118 lb 12.8 oz (53.9 kg)  09/06/21 118 lb 3.2 oz (53.6 kg)  05/17/20 121 lb (54.9 kg)     GEN:  Well nourished, well developed in no acute distress HEENT: Normal NECK: No JVD; No carotid bruits CARDIAC: RRR, no murmurs, rubs, gallops RESPIRATORY:  Clear to auscultation without rales, wheezing or rhonchi  ABDOMEN: Soft, non-tender, non-distended MUSCULOSKELETAL:  No edema; No deformity  SKIN: Warm and dry NEUROLOGIC:  Alert and oriented x 3 PSYCHIATRIC:  Normal affect   ASSESSMENT:    1. CAD in native artery   2. Hyperlipidemia, unspecified hyperlipidemia type   3. Essential hypertension   4. Prediabetes    PLAN:    In order of problems listed above:  #CAD s/p BMS to Lcx: -Patient with remote BMS to Lcx in 2009 -Myoview 2019 with no ischemia or infarction -Currently doing well without anginal symptoms -Continue ASA 81mg  daily -Continue lipitor 80mg  daily  #HTN: -Blood pressure well controlled at home 120-130s/80s -Continue amlodipine 10mg  daily -Continue lisinopril-HCTZ 20-12.5mg  daily -Continue metop 50mg  XL daily  #HLD: -Continue lipitor 80mg  daily -Repeat lipids today and if LDL not at goal, will add zetia 10mg  daily      Medication Adjustments/Labs and Tests Ordered: Current medicines are reviewed at length with the patient today.  Concerns regarding medicines are  outlined above.  Orders Placed This Encounter  Procedures   EKG 12-Lead   Meds ordered this encounter  Medications   amLODipine (NORVASC) 10 MG tablet    Sig: TAKE 1 TABLET BY MOUTH DAILY.    Dispense:  90 tablet    Refill:  3   atorvastatin (LIPITOR) 80 MG tablet    Sig: Take 1 tablet (80 mg total) by mouth daily.    Dispense:  90 tablet    Refill:  3   lisinopril-hydrochlorothiazide (ZESTORETIC) 20-12.5 MG tablet    Sig: Take 1 tablet by mouth daily.    Dispense:  90 tablet    Refill:  3   metoprolol succinate (TOPROL-XL) 50 MG 24 hr tablet  Sig: TAKE 1 TABLET BY MOUTH DAILY  WITH OR IMMEDIATELY FOLLOWING A  MEAL    Dispense:  90 tablet    Refill:  3    Patient Instructions  Medication Instructions:  No changes *If you need a refill on your cardiac medications before your next appointment, please call your pharmacy*   Lab Work: Today: lipid panel  If you have labs (blood work) drawn today and your tests are completely normal, you will receive your results only by: [place] (if you have MyChart) OR A paper copy in the mail If you have any lab test that is abnormal or we need to change your treatment, we will call you to review the results.   Testing/Procedures: none   Follow-Up: At [facility], you and your health needs are our priority.  As part of our continuing mission to provide you with exceptional heart care, we have created designated Provider Care Teams.  These Care Teams include your primary Cardiologist (physician) and Advanced Practice Providers (APPs -  Physician Assistants and Nurse Practitioners) who all work together to provide you with the care you need, when you need it.  We recommend signing up for the patient portal called "MyChart".  Sign up information is provided on this After Visit Summary.  MyChart is used to connect with patients for Virtual Visits (Telemedicine).  Patients are able to view lab/test results, encounter notes,  upcoming appointments, etc.  Non-urgent messages can be sent to your provider as well.   To learn more about what you can do with MyChart, go to [email or web address].    Your next appointment:   12 month(s)  Provider:   Freada Bergeron, MD       Signed, Freada Bergeron, MD  09/05/2022 10:04 AM    [place]

## 2022-09-04 ENCOUNTER — Other Ambulatory Visit: Payer: Self-pay

## 2022-09-05 ENCOUNTER — Ambulatory Visit: Payer: BC Managed Care – PPO | Attending: Cardiology | Admitting: Cardiology

## 2022-09-05 ENCOUNTER — Encounter: Payer: Self-pay | Admitting: Cardiology

## 2022-09-05 VITALS — BP 136/90 | HR 88 | Ht 63.0 in | Wt 118.8 lb

## 2022-09-05 DIAGNOSIS — E785 Hyperlipidemia, unspecified: Secondary | ICD-10-CM

## 2022-09-05 DIAGNOSIS — R7303 Prediabetes: Secondary | ICD-10-CM | POA: Diagnosis not present

## 2022-09-05 DIAGNOSIS — I2510 Atherosclerotic heart disease of native coronary artery without angina pectoris: Secondary | ICD-10-CM

## 2022-09-05 DIAGNOSIS — I10 Essential (primary) hypertension: Secondary | ICD-10-CM | POA: Diagnosis not present

## 2022-09-05 MED ORDER — AMLODIPINE BESYLATE 10 MG PO TABS: ORAL_TABLET | ORAL | 3 refills | Status: DC

## 2022-09-05 MED ORDER — METOPROLOL SUCCINATE ER 50 MG PO TB24: ORAL_TABLET | ORAL | 3 refills | Status: DC

## 2022-09-05 MED ORDER — ATORVASTATIN CALCIUM 80 MG PO TABS: 80.0000 mg | ORAL_TABLET | Freq: Every day | ORAL | 3 refills | Status: DC

## 2022-09-05 MED ORDER — LISINOPRIL-HYDROCHLOROTHIAZIDE 20-12.5 MG PO TABS: 1.0000 | ORAL_TABLET | Freq: Every day | ORAL | 3 refills | Status: DC

## 2022-09-05 NOTE — Patient Instructions (Signed)
Medication Instructions:  No changes *If you need a refill on your cardiac medications before your next appointment, please call your pharmacy*   Lab Work: Today: lipid panel  If you have labs (blood work) drawn today and your tests are completely normal, you will receive your results only by: [place] (if you have MyChart) OR A paper copy in the mail If you have any lab test that is abnormal or we need to change your treatment, we will call you to review the results.   Testing/Procedures: none   Follow-Up: At [facility], you and your health needs are our priority.  As part of our continuing mission to provide you with exceptional heart care, we have created designated Provider Care Teams.  These Care Teams include your primary Cardiologist (physician) and Advanced Practice Providers (APPs -  Physician Assistants and Nurse Practitioners) who all work together to provide you with the care you need, when you need it.  We recommend signing up for the patient portal called "MyChart".  Sign up information is provided on this After Visit Summary.  MyChart is used to connect with patients for Virtual Visits (Telemedicine).  Patients are able to view lab/test results, encounter notes, upcoming appointments, etc.  Non-urgent messages can be sent to your provider as well.   To learn more about what you can do with MyChart, go to [email or web address].    Your next appointment:   12 month(s)  Provider:   Freada Bergeron, MD

## 2022-09-05 NOTE — Addendum Note (Signed)
Addended by: Rodman Key on: 09/05/2022 10:16 AM   Modules accepted: Orders

## 2022-09-06 ENCOUNTER — Telehealth: Payer: Self-pay | Admitting: *Deleted

## 2022-09-06 DIAGNOSIS — I2510 Atherosclerotic heart disease of native coronary artery without angina pectoris: Secondary | ICD-10-CM

## 2022-09-06 DIAGNOSIS — E785 Hyperlipidemia, unspecified: Secondary | ICD-10-CM

## 2022-09-06 DIAGNOSIS — Z79899 Other long term (current) drug therapy: Secondary | ICD-10-CM

## 2022-09-06 LAB — LIPID PANEL
Chol/HDL Ratio: 2.4 ratio (ref 0.0–4.4)
Cholesterol, Total: 159 mg/dL (ref 100–199)
HDL: 66 mg/dL (ref >39)
LDL Chol Calc (NIH): 80 mg/dL (ref 0–99)
Triglycerides: 65 mg/dL (ref 0–149)
VLDL Cholesterol Cal: 13 mg/dL (ref 5–40)

## 2022-09-06 MED ORDER — EZETIMIBE 10 MG PO TABS: 10.0000 mg | ORAL_TABLET | Freq: Every day | ORAL | 1 refills | Status: DC

## 2022-09-06 NOTE — Telephone Encounter (Signed)
-----   Message from Nuala Alpha, LPN sent at [phone]  7:39 AM EDT -----  ----- Message ----- From: Freada Bergeron, MD Sent: 09/06/2022   7:00 AM EDT To: [facility] Triage  LDL cholesterol is slightly above goal (goal <70). Can we add zetia 10mg  daily and repeat lipids in 8 weeks to ensure at goal.

## 2022-09-06 NOTE — Telephone Encounter (Signed)
The patient has been notified of the result and verbalized understanding.  All questions (if any) were answered.  Pt is aware to start taking zetia 10 mg po daily and come in for repeat lipids in 8 weeks, to reassess LDL and if this is at goal or not with zetia use.  Confirmed the pharmacy of choice with the pt.  Scheduled the pt for repeat lipids in 8 weeks on 10/31/22.  She is aware to come fasting to this lab appt.   Pt verbalized understanding and agrees with this plan.

## 2022-10-31 ENCOUNTER — Ambulatory Visit: Payer: BC Managed Care – PPO | Attending: Cardiology

## 2022-10-31 DIAGNOSIS — E785 Hyperlipidemia, unspecified: Secondary | ICD-10-CM | POA: Diagnosis not present

## 2022-10-31 DIAGNOSIS — Z79899 Other long term (current) drug therapy: Secondary | ICD-10-CM

## 2022-10-31 DIAGNOSIS — I2510 Atherosclerotic heart disease of native coronary artery without angina pectoris: Secondary | ICD-10-CM

## 2022-11-01 LAB — LIPID PANEL
Chol/HDL Ratio: 2.3 ratio (ref 0.0–4.4)
Cholesterol, Total: 128 mg/dL (ref 100–199)
HDL: 55 mg/dL (ref >39)
LDL Chol Calc (NIH): 61 mg/dL (ref 0–99)
Triglycerides: 52 mg/dL (ref 0–149)
VLDL Cholesterol Cal: 12 mg/dL (ref 5–40)

## 2023-03-06 ENCOUNTER — Other Ambulatory Visit: Payer: Self-pay

## 2023-03-06 DIAGNOSIS — I2510 Atherosclerotic heart disease of native coronary artery without angina pectoris: Secondary | ICD-10-CM

## 2023-03-06 DIAGNOSIS — E785 Hyperlipidemia, unspecified: Secondary | ICD-10-CM

## 2023-03-06 DIAGNOSIS — Z79899 Other long term (current) drug therapy: Secondary | ICD-10-CM

## 2023-03-06 MED ORDER — EZETIMIBE 10 MG PO TABS
10.0000 mg | ORAL_TABLET | Freq: Every day | ORAL | 1 refills | Status: DC
Start: 2023-03-06 — End: 2023-08-31

## 2023-08-31 ENCOUNTER — Encounter: Payer: Self-pay | Admitting: Cardiology

## 2023-08-31 ENCOUNTER — Ambulatory Visit: Payer: BC Managed Care – PPO | Attending: Internal Medicine | Admitting: Cardiology

## 2023-08-31 ENCOUNTER — Other Ambulatory Visit: Payer: Self-pay

## 2023-08-31 VITALS — BP 128/86 | HR 56 | Ht 61.5 in | Wt 118.4 lb

## 2023-08-31 DIAGNOSIS — I2510 Atherosclerotic heart disease of native coronary artery without angina pectoris: Secondary | ICD-10-CM | POA: Diagnosis not present

## 2023-08-31 DIAGNOSIS — Z79899 Other long term (current) drug therapy: Secondary | ICD-10-CM

## 2023-08-31 DIAGNOSIS — E785 Hyperlipidemia, unspecified: Secondary | ICD-10-CM

## 2023-08-31 DIAGNOSIS — I10 Essential (primary) hypertension: Secondary | ICD-10-CM

## 2023-08-31 MED ORDER — AMLODIPINE BESYLATE 10 MG PO TABS: ORAL_TABLET | ORAL | 3 refills | Status: DC

## 2023-08-31 MED ORDER — LISINOPRIL-HYDROCHLOROTHIAZIDE 20-12.5 MG PO TABS: 1.0000 | ORAL_TABLET | Freq: Every day | ORAL | 3 refills | Status: AC

## 2023-08-31 MED ORDER — ATORVASTATIN CALCIUM 80 MG PO TABS: 80.0000 mg | ORAL_TABLET | Freq: Every day | ORAL | 3 refills | Status: DC

## 2023-08-31 MED ORDER — METOPROLOL SUCCINATE ER 50 MG PO TB24: ORAL_TABLET | ORAL | 3 refills | Status: AC

## 2023-08-31 MED ORDER — EZETIMIBE 10 MG PO TABS
10.0000 mg | ORAL_TABLET | Freq: Every day | ORAL | 3 refills | Status: AC
Start: 2023-08-31 — End: ?

## 2023-08-31 NOTE — Progress Notes (Signed)
 Cardiology Office Note:  .   Date:  08/31/2023  ID:  [name], DOB 12/09/1960, MRN [id number] PCP: System, Provider Not In  [facility] Providers Cardiologist:  Donato Schultz, MD     History of Present Illness: [name] is a 63 y.o. female Discussed the use of AI scribe software for clinical note transcription with the patient, who gave verbal consent to proceed.  History of Present Illness [name] is a 64 year old female with coronary artery disease and hypertension who presents for follow-up after stent placement. She was a former patient of Dr. Katrinka Blazing.  She has a history of coronary artery disease, with a bare metal stent placed in the left circumflex artery in 2009. A heart catheterization with a stress test in 2019 showed no ischemia or infarction. Since the stent placement, she has experienced no chest pain or shortness of breath. She quit smoking on the day of her stent placement and has not smoked since.  Her hypertension is well controlled with her current medication regimen, which includes amlodipine 10 mg daily, lisinopril/hydrochlorothiazide 20/12.5 mg daily, and metoprolol 50 mg daily. She reports no difficulties related to her blood pressure management.  She also has hyperlipidemia and is on atorvastatin 80 mg daily and Zetia 10 mg daily. Her LDL was 61 mg/dL as of May last year, which is at goal. She adheres to her medication regimen and maintains a healthy lifestyle, including exercise and diet.  In terms of her social history, she works at [employer] in [employer] and [employer] section. She has no primary care physician at the moment.      ROS: no cp  Studies Reviewed: Marland Kitchen   EKG Interpretation Date/Time:  Friday August 31 2023 08:03:46 EDT Ventricular Rate:  56 PR Interval:  152 QRS Duration:  78 QT Interval:  410 QTC Calculation: 395 R Axis:   35  Text Interpretation: Sinus bradycardia Low voltage QRS T wave abnormality, consider  inferior ischemia When compared with ECG of 03-Jul-2007 10:45, Borderline criteria for Inferior infarct are no longer Present T wave inversion now evident in Anterior leads Confirmed by Donato Schultz ([id number]) on 08/31/2023 8:20:03 AM    Results LABS LDL: 61 mg/dL (13/0865) Cr: 0.5 mg/dL Hb: 78.4 g/dL ALT: 16 U/L  DIAGNOSTIC Heart catheterization with stress test: No ischemia, no infarction (2019) Risk Assessment/Calculations:            Physical Exam:   VS:  BP 128/86   Pulse (!) 56   Ht 5' 1.5" (1.562 m)   Wt 118 lb 6.4 oz (53.7 kg)   SpO2 98%   BMI 22.01 kg/m    Wt Readings from Last 3 Encounters:  08/31/23 118 lb 6.4 oz (53.7 kg)  09/05/22 118 lb 12.8 oz (53.9 kg)  09/06/21 118 lb 3.2 oz (53.6 kg)    GEN: Well nourished, well developed in no acute distress NECK: No JVD; No carotid bruits CARDIAC: RRR, no murmurs, no rubs, no gallops RESPIRATORY:  Clear to auscultation without rales, wheezing or rhonchi  ABDOMEN: Soft, non-tender, non-distended EXTREMITIES:  No edema; No deformity   ASSESSMENT AND PLAN: .    Assessment and Plan Assessment & Plan Coronary Artery Disease with stent placement Bare metal stent placed in the left circumflex artery in 2009. No chest pain or dyspnea. 2019 stress test showed no ischemia or infarction. Current management focuses on secondary prevention of cardiac events. Adherence to medication and lifestyle modifications contributes to  effective management. - Continue aspirin 81 mg daily, atorvastatin 80 mg daily, Zetia 10 mg daily, metoprolol 50 mg daily, lisinopril/hydrochlorothiazide 20/12.5 mg daily, amlodipine 10 mg daily - Encourage adherence to lifestyle modifications including 30 minutes of exercise daily and a Mediterranean diet  Hypertension Well-controlled with current medication regimen. No recent episodes of elevated blood pressure. Adherence to medication and lifestyle changes contributes to effective management. - Continue current  antihypertensive regimen: amlodipine, lisinopril/hydrochlorothiazide, metoprolol  Hyperlipidemia Well-managed with current lipid-lowering therapy. LDL cholesterol at goal with a level of 61 mg/dL as of last May. Adherence to medication regimen maintains target lipid levels. - Continue atorvastatin 80 mg daily and Zetia 10 mg daily  Smoking cessation Successfully quit smoking since 2009 following stent placement. No relapse reported. Smoking cessation is a critical component of secondary prevention strategy. - Continue to abstain from smoking          Signed, Donato Schultz, MD

## 2023-08-31 NOTE — Patient Instructions (Signed)
 Medication Instructions:  The current medical regimen is effective;  continue present plan and medications.  *If you need a refill on your cardiac medications before your next appointment, please call your pharmacy*   Follow-Up: At [facility], you and your health needs are our priority.  As part of our continuing mission to provide you with exceptional heart care, our providers are all part of one team.  This team includes your primary Cardiologist (physician) and Advanced Practice Providers or APPs (Physician Assistants and Nurse Practitioners) who all work together to provide you with the care you need, when you need it.  Your next appointment:   1 year(s)  Provider:   Jari Favre, PA-C, Robin Searing, NP, Jacolyn Reedy, PA-C, Tereso Newcomer, PA-C, or Perlie Gold, PA-C         We recommend signing up for the patient portal called "MyChart".  Sign up information is provided on this After Visit Summary.  MyChart is used to connect with patients for Virtual Visits (Telemedicine).  Patients are able to view lab/test results, encounter notes, upcoming appointments, etc.  Non-urgent messages can be sent to your provider as well.   To learn more about what you can do with MyChart, go to [email or web address].      1st Floor: - Lobby - Registration  - Pharmacy  - Lab - Cafe  2nd Floor: - PV Lab - Diagnostic Testing (echo, CT, nuclear med)  3rd Floor: - Vacant  4th Floor: - TCTS (cardiothoracic surgery) - AFib Clinic - Structural Heart Clinic - Vascular Surgery  - Vascular Ultrasound  5th Floor: - [facility] (general and EP) - Clinical Pharmacy for coumadin, hypertension, lipid, weight-loss medications, and med management appointments    Valet parking services will be available as well.      Please establish with a primary care doctor.

## 2023-09-06 ENCOUNTER — Other Ambulatory Visit: Payer: Self-pay

## 2023-09-06 MED ORDER — AMLODIPINE BESYLATE 10 MG PO TABS: ORAL_TABLET | ORAL | 3 refills | Status: AC

## 2023-09-10 ENCOUNTER — Other Ambulatory Visit: Payer: Self-pay

## 2023-09-10 MED ORDER — ATORVASTATIN CALCIUM 80 MG PO TABS: 80.0000 mg | ORAL_TABLET | Freq: Every day | ORAL | 3 refills | Status: AC
